# Patient Record
Sex: Male | Born: 1980
Health system: Southern US, Community
[De-identification: ages and names within clinical notes are randomized; demographics above are authoritative.]

## PROBLEM LIST (undated history)

## (undated) DIAGNOSIS — F419 Anxiety disorder, unspecified: Secondary | ICD-10-CM

## (undated) DIAGNOSIS — F329 Major depressive disorder, single episode, unspecified: Secondary | ICD-10-CM

## (undated) DIAGNOSIS — F32A Depression, unspecified: Secondary | ICD-10-CM

## (undated) DIAGNOSIS — M109 Gout, unspecified: Secondary | ICD-10-CM

## (undated) DIAGNOSIS — I10 Essential (primary) hypertension: Secondary | ICD-10-CM

## (undated) DIAGNOSIS — M1990 Unspecified osteoarthritis, unspecified site: Secondary | ICD-10-CM

## (undated) HISTORY — PX: FOOT SURGERY: SHX648

## (undated) HISTORY — DX: Unspecified osteoarthritis, unspecified site: M19.90

## (undated) HISTORY — DX: Gout, unspecified: M10.9

## (undated) HISTORY — DX: Major depressive disorder, single episode, unspecified: F32.9

## (undated) HISTORY — PX: APPENDECTOMY: SHX54

## (undated) HISTORY — DX: Depression, unspecified: F32.A

## (undated) HISTORY — PX: GASTRIC BYPASS: SHX52

## (undated) HISTORY — PX: CHOLECYSTECTOMY: SHX55

## (undated) HISTORY — DX: Essential (primary) hypertension: I10

---

## 2004-11-03 ENCOUNTER — Other Ambulatory Visit: Payer: Self-pay

## 2004-11-03 ENCOUNTER — Emergency Department: Payer: Self-pay | Admitting: Emergency Medicine

## 2005-08-09 ENCOUNTER — Emergency Department: Payer: Self-pay | Admitting: Emergency Medicine

## 2005-10-28 ENCOUNTER — Encounter: Admission: RE | Admit: 2005-10-28 | Discharge: 2005-10-28 | Payer: Self-pay | Admitting: Orthopedic Surgery

## 2007-11-19 ENCOUNTER — Emergency Department: Payer: Self-pay | Admitting: Emergency Medicine

## 2009-07-03 ENCOUNTER — Ambulatory Visit: Payer: Self-pay | Admitting: Podiatry

## 2010-05-26 ENCOUNTER — Emergency Department: Payer: Self-pay | Admitting: Emergency Medicine

## 2010-06-02 ENCOUNTER — Encounter: Payer: Self-pay | Admitting: Orthopedic Surgery

## 2010-06-04 ENCOUNTER — Ambulatory Visit: Payer: Self-pay | Admitting: Specialist

## 2010-06-12 ENCOUNTER — Ambulatory Visit: Payer: Self-pay | Admitting: Specialist

## 2010-07-11 ENCOUNTER — Ambulatory Visit: Payer: Self-pay | Admitting: Specialist

## 2010-09-20 ENCOUNTER — Emergency Department: Payer: Self-pay | Admitting: Emergency Medicine

## 2012-02-20 ENCOUNTER — Emergency Department: Payer: Self-pay | Admitting: Emergency Medicine

## 2012-02-20 LAB — COMPREHENSIVE METABOLIC PANEL
Albumin: 4.1 g/dL (ref 3.4–5.0)
BUN: 15 mg/dL (ref 7–18)
Bilirubin,Total: 0.6 mg/dL (ref 0.2–1.0)
Chloride: 109 mmol/L — ABNORMAL HIGH (ref 98–107)
Creatinine: 1.38 mg/dL — ABNORMAL HIGH (ref 0.60–1.30)
Glucose: 83 mg/dL (ref 65–99)
SGPT (ALT): 23 U/L (ref 12–78)
Total Protein: 7.7 g/dL (ref 6.4–8.2)

## 2012-02-20 LAB — CBC
Platelet: 198 10*3/uL (ref 150–440)
RBC: 5.15 10*6/uL (ref 4.40–5.90)
WBC: 8 10*3/uL (ref 3.8–10.6)

## 2012-02-20 LAB — CK TOTAL AND CKMB (NOT AT ARMC)
CK, Total: 216 U/L (ref 35–232)
CK-MB: <0.5 ng/mL — ABNORMAL LOW (ref 0.5–3.6)

## 2012-02-21 LAB — URINALYSIS, COMPLETE
Bacteria: NONE SEEN
Glucose,UR: NEGATIVE mg/dL (ref 0–75)
Nitrite: NEGATIVE
Protein: NEGATIVE
RBC,UR: NONE SEEN /HPF (ref 0–5)
Specific Gravity: 1.016 (ref 1.003–1.030)
WBC UR: 32 /HPF (ref 0–5)

## 2012-02-21 LAB — DRUG SCREEN, URINE
Cocaine Metabolite,Ur ~~LOC~~: NEGATIVE (ref ?–300)
Methadone, Ur Screen: NEGATIVE (ref ?–300)
Opiate, Ur Screen: NEGATIVE (ref ?–300)
Phencyclidine (PCP) Ur S: NEGATIVE (ref ?–25)
Tricyclic, Ur Screen: NEGATIVE (ref ?–1000)

## 2012-11-14 ENCOUNTER — Inpatient Hospital Stay: Payer: Self-pay | Admitting: Surgery

## 2012-11-14 LAB — COMPREHENSIVE METABOLIC PANEL
Albumin: 3.9 g/dL (ref 3.4–5.0)
BUN: 11 mg/dL (ref 7–18)
Bilirubin,Total: 1 mg/dL (ref 0.2–1.0)
Co2: 31 mmol/L (ref 21–32)
EGFR (Non-African Amer.): >60
Osmolality: 278 (ref 275–301)
Potassium: 3.6 mmol/L (ref 3.5–5.1)

## 2012-11-14 LAB — URINALYSIS, COMPLETE
Blood: NEGATIVE
Ketone: NEGATIVE
Leukocyte Esterase: NEGATIVE
Ph: 5 (ref 4.5–8.0)
Protein: NEGATIVE
RBC,UR: 1 /HPF (ref 0–5)
Squamous Epithelial: NONE SEEN

## 2012-11-14 LAB — CBC
MCHC: 34.4 g/dL (ref 32.0–36.0)
Platelet: 251 10*3/uL (ref 150–440)
RBC: 5.41 10*6/uL (ref 4.40–5.90)
RDW: 14.1 % (ref 11.5–14.5)

## 2014-02-04 ENCOUNTER — Emergency Department: Payer: Self-pay | Admitting: Emergency Medicine

## 2014-04-03 DIAGNOSIS — E669 Obesity, unspecified: Secondary | ICD-10-CM | POA: Insufficient documentation

## 2014-04-03 DIAGNOSIS — F419 Anxiety disorder, unspecified: Secondary | ICD-10-CM | POA: Insufficient documentation

## 2014-04-03 DIAGNOSIS — Z9889 Other specified postprocedural states: Secondary | ICD-10-CM | POA: Insufficient documentation

## 2014-08-11 ENCOUNTER — Emergency Department
Admit: 2014-08-11 | Disposition: A | Discharge status: Home / Self Care | Payer: Self-pay | Admitting: Emergency Medicine

## 2014-08-17 ENCOUNTER — Emergency Department
Admit: 2014-08-17 | Disposition: A | Discharge status: Home / Self Care | Payer: Self-pay | Admitting: Emergency Medicine

## 2014-08-17 LAB — BASIC METABOLIC PANEL
Anion Gap: 6 — ABNORMAL LOW (ref 7–16)
BUN: 12 mg/dL
CALCIUM: 9 mg/dL
CHLORIDE: 107 mmol/L
Co2: 27 mmol/L
Creatinine: 1.23 mg/dL
EGFR (African American): >60
Glucose: 91 mg/dL
Potassium: 3.8 mmol/L
Sodium: 140 mmol/L

## 2014-08-17 LAB — CBC WITH DIFFERENTIAL/PLATELET
BASOS ABS: 0 10*3/uL (ref 0.0–0.1)
Basophil %: 0.7 %
Eosinophil #: 0.2 10*3/uL (ref 0.0–0.7)
Eosinophil %: 3.6 %
HCT: 40.9 % (ref 40.0–52.0)
HGB: 13.5 g/dL (ref 13.0–18.0)
LYMPHS ABS: 2.5 10*3/uL (ref 1.0–3.6)
LYMPHS PCT: 39.1 %
MCH: 26.2 pg (ref 26.0–34.0)
MCHC: 32.9 g/dL (ref 32.0–36.0)
MCV: 80 fL (ref 80–100)
MONOS PCT: 8.9 %
Monocyte #: 0.6 x10 3/mm (ref 0.2–1.0)
Neutrophil #: 3.1 10*3/uL (ref 1.4–6.5)
Neutrophil %: 47.7 %
PLATELETS: 188 10*3/uL (ref 150–440)
RBC: 5.14 10*6/uL (ref 4.40–5.90)
RDW: 13.8 % (ref 11.5–14.5)
WBC: 6.4 10*3/uL (ref 3.8–10.6)

## 2014-08-17 LAB — URIC ACID: URIC ACID: 8 mg/dL — AB

## 2014-09-01 NOTE — Op Note (Signed)
PATIENT NAME:  Nicholas MccreedyCHELLEY, Dakari M MR#:  [id number] DATE OF BIRTH:  01-02-1981  DATE OF PROCEDURE:  11/14/2012  PREOPERATIVE DIAGNOSIS: Acute appendicitis.   POSTOPERATIVE DIAGNOSIS: Acute suppurative appendicitis.   PROCEDURE PERFORMED:  Appendectomy  SURGEON: Quentin Orealph L. Ely, III, MD   ANESTHESIA: General.   OPERATIVE PROCEDURE: With the patient in the supine position after induction of appropriate anesthesia, the patient's abdomen was prepped with ChloraPrep and draped with sterile towels. An infraumbilical incision was made in the standard fashion, carried down bluntly through the subcutaneous tissue. The patient was placed in a head down, feet up position. The Veress needle was used to cannulate the peritoneal cavity. CO2 was insufflated to appropriate pressure measurements. When approximately 2.5 liters of CO2 were instilled, the Veress needle was withdrawn. An 11 mm [employer] port was inserted into the peritoneal cavity. Intraperitoneal position was confirmed. CO2 was reinsufflated. There was large dilated suppurative appendicitis. The appendix was identified in the right lower quadrant. Mid epigastric transverse incision was made and an 11 mm port inserted under direct vision. A suprapubic transverse incision was made and a 12 mm port inserted under direct vision. The camera was moved to the upper port and dissection carried out through the 2 lower ports. The base of the appendix was identified with some difficulty. The mesoappendix was divided between 2 applications of the Endo GIA stapler carrying a white load, and the appendix was dissected back to its base without significant bleeding. The base of the appendix was divided using a single application of the Endo GIA stapler carrying a blue load. The appendix was captured and the Endo Catch apparatus removed through the suprapubic incision. The area was copiously irrigated with 2 liters of warm saline solution. Hemostasis appeared to be  satisfactory and no significant infection. The suprapubic incision was closed with figure-of-eight suture of 0 Vicryl using the suture passer apparatus and a 0 Vicryl. The abdomen was desufflated, and all ports were withdrawn without difficulty. Skin incisions were closed with 5-0 nylon; 0.5% Marcaine was injected for postoperative pain control. Sterile dressings were applied.    The patient returned to the recovery room having tolerated the procedure well. Sponge, instrument and needle counts were correct x 2 in the operating room.     ____________________________ Quentin Orealph L. Ely III, MD rle:cb D: 11/14/2012 17:34:03 ET T: 11/14/2012 21:52:18 ET JOB#: [id number]  cc: Carmie Endalph L. Ely III, MD, <Dictator> Mickel Crowobert J. Mead, MD Quentin OreALPH L ELY MD ELECTRONICALLY SIGNED 11/22/2012 21:31

## 2014-09-01 NOTE — H&P (Signed)
PATIENT NAME:  Nicholas [name], Nicholas [name] MR#:  [id number] DATE OF BIRTH:  Oct 15, 1980  DATE OF ADMISSION:  11/14/2012  PRIMARY CARE PHYSICIAN:  [facility].   ADMITTING PHYSICIAN:  Dr. Michela PitcherEly.   CHIEF COMPLAINT:  Abdominal pain, nausea.   BRIEF HISTORY:  Nicholas [name] is a 34 year old gentleman who was seen in the Emergency Room with a 36 to 48-hour history of abdominal discomfort, bloating and nausea. First symptom was on July 4th when he noted some bloating and abdominal distention following a meal. He felt uncomfortable. He thought he might be constipated. He took a laxative without any real improvement. Over the course of the next 24 hours, pain has worsened primarily with  suprapubic right lower quadrant pain. He presented to the Emergency Room tonight for further evaluation. White blood cell count was slightly elevated. CT scan was performed which demonstrated what appeared to be a thickened, inflamed appendix with pericecal and periappendiceal fluid, suggestive of acute appendicitis. The surgical service was consulted.   The patient denies any other significant symptoms. He has no diarrhea, fever or chills. He has no history of hepatitis, yellow jaundice, pancreatitis, peptic ulcer disease or diverticulitis. He does have a previous history of biliary tract disease. Underwent laparoscopic cholecystectomy. Has also had a history of morbid obesity and underwent a laparoscopic sleeve gastrectomy. He has lost approximately 140 pounds. He has no history of cardiac disease, hypertension or diabetes currently, although he was hypertensive prior to his weight loss. He does not smoke cigarettes. Drinks alcohol only occasionally.   FAMILY HISTORY:  Noncontributory.   MEDICATIONS:  He takes no medications regularly.   ALLERGIES:  He has no medical allergies.   REVIEW OF SYSTEMS:  Undertaken with the patient and it is negative. He has no other significant problems at the present time.    PHYSICAL  EXAMINATION:   GENERAL:  He is an alert, relatively pleasant gentleman in moderate distress from pain.  VITALS:  Blood pressure 129/75, heart rate 62 and regular. Pain scale is a 0 at this time as he has received pain medicine.  HEENT:  Unremarkable. He has no scleral icterus. No pupillary abnormalities.  NECK:  Supple, nontender with midline trachea. No adenopathy.  CHEST:  Clear with no adventitious sounds and normal pulmonary excursion.  CARDIAC:  No murmurs or gallops. He seems to be in normal sinus rhythm.  ABDOMEN:  Generally soft, but does have point tenderness in the right lower quadrant with moderate guarding and no rebound. He has hypoactive present bowel sounds.  EXTREMITY:  Reveals full range of motion. No deformities and good distal pulses.  PSYCHIATRIC:  Reveals some mild affect changes, but no disorientation.   I have independently reviewed his CT scan. His findings, both clinically and on scans, suggest acute appendicitis. We will recommend admission to the hospital with surgical intervention. I am concerned his appendix may be ruptured. The risks of appendectomy and the implications of rupture have been outlined to the patient and accepted.   ____________________________ Carmie Endalph L. Ely III, MD rle:kc D: 11/14/2012 07:54:06 ET T: 11/14/2012 08:14:45 ET JOB#: [id number]  cc: Carmie Endalph L. Ely III, MD, <Dictator> [facility] Quentin OreALPH L ELY MD ELECTRONICALLY SIGNED 11/22/2012 21:31

## 2014-11-06 ENCOUNTER — Ambulatory Visit: Payer: 59 | Admitting: Podiatry

## 2014-12-04 ENCOUNTER — Ambulatory Visit: Payer: 59 | Admitting: Podiatry

## 2015-04-29 ENCOUNTER — Encounter: Payer: Self-pay | Admitting: Emergency Medicine

## 2015-04-29 ENCOUNTER — Emergency Department
Admission: EM | Admit: 2015-04-29 | Discharge: 2015-04-29 | Disposition: A | Discharge status: Home / Self Care | Payer: BLUE CROSS/BLUE SHIELD | Attending: Emergency Medicine | Admitting: Emergency Medicine

## 2015-04-29 ENCOUNTER — Emergency Department: Payer: BLUE CROSS/BLUE SHIELD

## 2015-04-29 DIAGNOSIS — F419 Anxiety disorder, unspecified: Secondary | ICD-10-CM | POA: Diagnosis not present

## 2015-04-29 DIAGNOSIS — R079 Chest pain, unspecified: Secondary | ICD-10-CM

## 2015-04-29 DIAGNOSIS — R51 Headache: Secondary | ICD-10-CM | POA: Diagnosis not present

## 2015-04-29 HISTORY — DX: Anxiety disorder, unspecified: F41.9

## 2015-04-29 LAB — TROPONIN I

## 2015-04-29 LAB — CBC
HCT: 43 % (ref 40.0–52.0)
Hemoglobin: 14.3 g/dL (ref 13.0–18.0)
MCH: 25.9 pg — AB (ref 26.0–34.0)
MCHC: 33.2 g/dL (ref 32.0–36.0)
MCV: 78.1 fL — AB (ref 80.0–100.0)
PLATELETS: 213 10*3/uL (ref 150–440)
RBC: 5.5 MIL/uL (ref 4.40–5.90)
RDW: 14.1 % (ref 11.5–14.5)
WBC: 6.9 10*3/uL (ref 3.8–10.6)

## 2015-04-29 LAB — BASIC METABOLIC PANEL
Anion gap: 4 — ABNORMAL LOW (ref 5–15)
BUN: 13 mg/dL (ref 6–20)
CALCIUM: 9.7 mg/dL (ref 8.9–10.3)
CHLORIDE: 109 mmol/L (ref 101–111)
CO2: 28 mmol/L (ref 22–32)
CREATININE: 1.49 mg/dL — AB (ref 0.61–1.24)
GFR calc non Af Amer: 59 mL/min — ABNORMAL LOW (ref >60)
GLUCOSE: 87 mg/dL (ref 65–99)
Potassium: 4.4 mmol/L (ref 3.5–5.1)
Sodium: 141 mmol/L (ref 135–145)

## 2015-04-29 MED ORDER — LORAZEPAM 1 MG PO TABS: 1.0000 mg | ORAL_TABLET | Freq: Three times a day (TID) | ORAL | Status: DC | PRN

## 2015-04-29 NOTE — Discharge Instructions (Signed)
You have been seen in the emergency department for chest pain. This could likely be related to her underlying anxiety. Please take your medication as needed, as prescribed. Please call the number provided for [facility] to arrange a follow-up appointment to discuss this further including the possibility of obtaining a stress test.   Nonspecific Chest Pain  Chest pain can be caused by many different conditions. There is always a chance that your pain could be related to something serious, such as a heart attack or a blood clot in your lungs. Chest pain can also be caused by conditions that are not life-threatening. If you have chest pain, it is very important to follow up with your health care provider. CAUSES  Chest pain can be caused by:  Heartburn.  Pneumonia or bronchitis.  Anxiety or stress.  Inflammation around your heart (pericarditis) or lung (pleuritis or pleurisy).  A blood clot in your lung.  A collapsed lung (pneumothorax). It can develop suddenly on its own (spontaneous pneumothorax) or from trauma to the chest.  Shingles infection (varicella-zoster virus).  Heart attack.  Damage to the bones, muscles, and cartilage that make up your chest wall. This can include:  Bruised bones due to injury.  Strained muscles or cartilage due to frequent or repeated coughing or overwork.  Fracture to one or more ribs.  Sore cartilage due to inflammation (costochondritis). RISK FACTORS  Risk factors for chest pain may include:  Activities that increase your risk for trauma or injury to your chest.  Respiratory infections or conditions that cause frequent coughing.  Medical conditions or overeating that can cause heartburn.  Heart disease or family history of heart disease.  Conditions or health behaviors that increase your risk of developing a blood clot.  Having had chicken pox (varicella zoster). SIGNS AND SYMPTOMS Chest pain can feel like:  Burning or tingling on  the surface of your chest or deep in your chest.  Crushing, pressure, aching, or squeezing pain.  Dull or sharp pain that is worse when you move, cough, or take a deep breath.  Pain that is also felt in your back, neck, shoulder, or arm, or pain that spreads to any of these areas. Your chest pain may come and go, or it may stay constant. DIAGNOSIS Lab tests or other studies may be needed to find the cause of your pain. Your health care provider may have you take a test called an ambulatory ECG (electrocardiogram). An ECG records your heartbeat patterns at the time the test is performed. You may also have other tests, such as:  Transthoracic echocardiogram (TTE). During echocardiography, sound waves are used to create a picture of all of the heart structures and to look at how blood flows through your heart.  Transesophageal echocardiogram (TEE).This is a more advanced imaging test that obtains images from inside your body. It allows your health care provider to see your heart in finer detail.  Cardiac monitoring. This allows your health care provider to monitor your heart rate and rhythm in real time.  Holter monitor. This is a portable device that records your heartbeat and can help to diagnose abnormal heartbeats. It allows your health care provider to track your heart activity for several days, if needed.  Stress tests. These can be done through exercise or by taking medicine that makes your heart beat more quickly.  Blood tests.  Imaging tests. TREATMENT  Your treatment depends on what is causing your chest pain. Treatment may include:  Medicines. These  may include:  Acid blockers for heartburn.  Anti-inflammatory medicine.  Pain medicine for inflammatory conditions.  Antibiotic medicine, if an infection is present.  Medicines to dissolve blood clots.  Medicines to treat coronary artery disease.  Supportive care for conditions that do not require medicines. This may  include:  Resting.  Applying heat or cold packs to injured areas.  Limiting activities until pain decreases. HOME CARE INSTRUCTIONS  If you were prescribed an antibiotic medicine, finish it all even if you start to feel better.  Avoid any activities that bring on chest pain.  Do not use any tobacco products, including cigarettes, chewing tobacco, or electronic cigarettes. If you need help quitting, ask your health care provider.  Do not drink alcohol.  Take medicines only as directed by your health care provider.  Keep all follow-up visits as directed by your health care provider. This is important. This includes any further testing if your chest pain does not go away.  If heartburn is the cause for your chest pain, you may be told to keep your head raised (elevated) while sleeping. This reduces the chance that acid will go from your stomach into your esophagus.  Make lifestyle changes as directed by your health care provider. These may include:  Getting regular exercise. Ask your health care provider to suggest some activities that are safe for you.  Eating a heart-healthy diet. A registered dietitian can help you to learn healthy eating options.  Maintaining a healthy weight.  Managing diabetes, if necessary.  Reducing stress. SEEK MEDICAL CARE IF:  Your chest pain does not go away after treatment.  You have a rash with blisters on your chest.  You have a fever. SEEK IMMEDIATE MEDICAL CARE IF:   Your chest pain is worse.  You have an increasing cough, or you cough up blood.  You have severe abdominal pain.  You have severe weakness.  You faint.  You have chills.  You have sudden, unexplained chest discomfort.  You have sudden, unexplained discomfort in your arms, back, neck, or jaw.  You have shortness of breath at any time.  You suddenly start to sweat, or your skin gets clammy.  You feel nauseous or you vomit.  You suddenly feel light-headed or  dizzy.  Your heart begins to beat quickly, or it feels like it is skipping beats. These symptoms may represent a serious problem that is an emergency. Do not wait to see if the symptoms will go away. Get medical help right away. Call your local emergency services (911 in the [place] Do not drive yourself to the hospital.   This information is not intended to replace advice given to you by your health care provider. Make sure you discuss any questions you have with your health care provider.   Document Released: 02/05/2005 Document Revised: 05/19/2014 Document Reviewed: 12/02/2013 Elsevier Interactive Patient Education [employer].

## 2015-04-29 NOTE — ED Notes (Signed)
Pt presents to ED with chest pain that started when he went to work last Monday. Pt states he noticed it went he ate food. Pt states this has been consistent all week with headaches and nausea. Pt states he has been taking BC powder for the headaches 2x a day. 6/10 pain

## 2015-04-29 NOTE — ED Provider Notes (Signed)
[facility] Emergency Department Provider Note  Time seen: 2:58 PM  I have reviewed the triage vital signs and the nursing notes.   HISTORY  Chief Complaint Chest Pain    HPI Nicholas [name] is a 34 y.o. male with a past medical history of anxiety who presented to the emergency department with chest discomfort. According to the patient for the past 5-6 days he has had intermittent chest discomfort, along with intermittent headaches. Denies any shortness of breath, nausea, diaphoresis. States he seems to have somewhat worse pain while he is eating, states sometimes pain will last for several minutes sometimes several hours. States mild chest tightness in the upper chest currently. States he would not call it "pain." States a history of anxiety which typically presents as chest discomfort for the patient. Recently moved to [place], and is no longer prescribe medications for his anxiety at the moment.     Past Medical History  Diagnosis Date  . Anxiety     There are no active problems to display for this patient.   Past Surgical History  Procedure Laterality Date  . Cholecystectomy    . Gastric bypass      No current outpatient prescriptions on file.  Allergies Review of patient's allergies indicates no known allergies.  History reviewed. No pertinent family history.  Social History Social History  Substance Use Topics  . Smoking status: Never Smoker   . Smokeless tobacco: None  . Alcohol Use: No    Review of Systems Constitutional: Negative for fever. Cardiovascular: Upper chest discomfort Respiratory: Negative for shortness of breath. Gastrointestinal: Negative for abdominal pain Genitourinary: Negative for dysuria. Musculoskeletal: Negative for back pain. Neurological: Intermittent headache, denies any currently. Denies any focal weakness or numbness. 10-point ROS otherwise  negative.  ____________________________________________   PHYSICAL EXAM:  VITAL SIGNS: ED Triage Vitals  Enc Vitals Group     BP 04/29/15 1203 127/74 mmHg     Pulse Rate 04/29/15 1203 94     Resp 04/29/15 1203 18     Temp 04/29/15 1203 98.4 F (36.9 C)     Temp Source 04/29/15 1203 Oral     SpO2 04/29/15 1203 99 %     Weight 04/29/15 1203 320 lb (145.151 kg)     Height 04/29/15 1203  (1.905 m)     Head Cir --      Peak Flow --      Pain Score 04/29/15 1204 6     Pain Loc --      Pain Edu? --      Excl. in GC? --     Constitutional: Alert and oriented. Well appearing and in no distress. Eyes: Normal exam ENT   Head: Normocephalic and atraumatic.   Mouth/Throat: Mucous membranes are moist. Cardiovascular: Normal rate, regular rhythm. No murmur Respiratory: Normal respiratory effort without tachypnea nor retractions. Breath sounds are clear  Gastrointestinal: Soft and nontender. No distention.   Musculoskeletal: Nontender with normal range of motion in all extremities. Neurologic:  Normal speech and language. No gross focal neurologic deficits Skin:  Skin is warm, dry and intact.  Psychiatric: Mood and affect are normal. Speech and behavior are normal.   ____________________________________________    EKG  EKG reviewed and interpreted, self shows normal sinus rhythm at 84 bpm, narrow QRS, normal axis, normal intervals, nonspecific ST changes but no concerning abnormalities noted.  ____________________________________________    RADIOLOGY  Chest x-ray shows no acute disease.  INITIAL IMPRESSION / ASSESSMENT  AND PLAN / ED COURSE  Pertinent labs & imaging results that were available during my care of the patient were reviewed by me and considered in my medical decision making (see chart for details).  Patient presents with intermittent upper chest discomfort, denies any currently but states it has been ongoing for 5-6 days. Patient's labs are within  normal limits including negative troponin. Chest x-ray negative. Patient states a history of anxiety which often presents as chest discomfort but is no longer on medication since he moved to [place] 3 months ago. Overall the patient appears very well with a normal examination. We'll discharge the patient on a short course of anxiety medication, as well as [facility] follow-up to arrange a stress test. Patient is agreeable to this plan.  ____________________________________________   FINAL CLINICAL IMPRESSION(S) / ED DIAGNOSES  Chest pain Anxiety   Minna AntisKevin Quaniya Damas, MD 04/29/15 [phone]

## 2015-07-16 ENCOUNTER — Ambulatory Visit (INDEPENDENT_AMBULATORY_CARE_PROVIDER_SITE_OTHER): Payer: BLUE CROSS/BLUE SHIELD | Admitting: Family

## 2015-07-16 ENCOUNTER — Encounter: Payer: Self-pay | Admitting: Family

## 2015-07-16 VITALS — BP 110/80 | HR 85 | Temp 98.0°F | Resp 16 | Ht 74.0 in | Wt 330.8 lb

## 2015-07-16 DIAGNOSIS — F411 Generalized anxiety disorder: Secondary | ICD-10-CM

## 2015-07-16 DIAGNOSIS — J302 Other seasonal allergic rhinitis: Secondary | ICD-10-CM

## 2015-07-16 DIAGNOSIS — G479 Sleep disorder, unspecified: Secondary | ICD-10-CM | POA: Diagnosis not present

## 2015-07-16 MED ORDER — LEVOCETIRIZINE DIHYDROCHLORIDE 5 MG PO TABS: 5.0000 mg | ORAL_TABLET | Freq: Every evening | ORAL | Status: DC

## 2015-07-16 MED ORDER — LORAZEPAM 1 MG PO TABS: 1.0000 mg | ORAL_TABLET | Freq: Two times a day (BID) | ORAL | Status: DC | PRN

## 2015-07-16 NOTE — Assessment & Plan Note (Signed)
Seasonal allergies with questionable use of over-the-counter medication. Start Xyzal. Continue nasal corticosteroids as needed. If symptoms worsen or fail to improve consider allergy testing or Singulair.

## 2015-07-16 NOTE — Assessment & Plan Note (Signed)
Currently maintained on over-the-counter sleep aids and reporting approximately 4-5 hours of sleep nightly as he is on the road. Continue current dosage of over-the-counter medication and follow-up if symptoms worsen.

## 2015-07-16 NOTE — Assessment & Plan Note (Signed)
Symptoms and exam consistent with anxiety that remains labile. Discussed using lorazepam as needed as opposed to twice daily as his symptoms come and go. Change lorazepam to 1 tablet by mouth twice daily as needed for anxiety. [place] controlled substance database reviewed with no irregularities. Follow-up in one month to determine effectiveness

## 2015-07-16 NOTE — Patient Instructions (Signed)
Thank you for choosing [facility].  Summary/Instructions:  Your prescription(s) have been submitted to your pharmacy or been printed and provided for you. Please take as directed and contact our office if you believe you are having problem(s) with the medication(s) or have any questions.  If your symptoms worsen or fail to improve, please contact our office for further instruction, or in case of emergency go directly to the emergency room at the closest medical facility.   Allergic Rhinitis Allergic rhinitis is when the mucous membranes in the nose respond to allergens. Allergens are particles in the air that cause your body to have an allergic reaction. This causes you to release allergic antibodies. Through a chain of events, these eventually cause you to release histamine into the blood stream. Although meant to protect the body, it is this release of histamine that causes your discomfort, such as frequent sneezing, congestion, and an itchy, runny nose.  CAUSES Seasonal allergic rhinitis (hay fever) is caused by pollen allergens that may come from grasses, trees, and weeds. Year-round allergic rhinitis (perennial allergic rhinitis) is caused by allergens such as house dust mites, pet dander, and mold spores. SYMPTOMS  Nasal stuffiness (congestion).  Itchy, runny nose with sneezing and tearing of the eyes. DIAGNOSIS Your health care provider can help you determine the allergen or allergens that trigger your symptoms. If you and your health care provider are unable to determine the allergen, skin or blood testing may be used. Your health care provider will diagnose your condition after taking your health history and performing a physical exam. Your health care provider may assess you for other related conditions, such as asthma, pink eye, or an ear infection. TREATMENT Allergic rhinitis does not have a cure, but it can be controlled by:  Medicines that block allergy symptoms. These may  include allergy shots, nasal sprays, and oral antihistamines.  Avoiding the allergen. Hay fever may often be treated with antihistamines in pill or nasal spray forms. Antihistamines block the effects of histamine. There are over-the-counter medicines that may help with nasal congestion and swelling around the eyes. Check with your health care provider before taking or giving this medicine. If avoiding the allergen or the medicine prescribed do not work, there are many new medicines your health care provider can prescribe. Stronger medicine may be used if initial measures are ineffective. Desensitizing injections can be used if medicine and avoidance does not work. Desensitization is when a patient is given ongoing shots until the body becomes less sensitive to the allergen. Make sure you follow up with your health care provider if problems continue. HOME CARE INSTRUCTIONS It is not possible to completely avoid allergens, but you can reduce your symptoms by taking steps to limit your exposure to them. It helps to know exactly what you are allergic to so that you can avoid your specific triggers. SEEK MEDICAL CARE IF:  You have a fever.  You develop a cough that does not stop easily (persistent).  You have shortness of breath.  You start wheezing.  Symptoms interfere with normal daily activities.   This information is not intended to replace advice given to you by your health care provider. Make sure you discuss any questions you have with your health care provider.   Document Released: 01/21/2001 Document Revised: 05/19/2014 Document Reviewed: 01/03/2013 Elsevier Interactive Patient Education 2016 [place] and Stress Management Stress is a normal reaction to life events. It is what you feel when life demands more than  you are used to or more than you can handle. Some stress can be useful. For example, the stress reaction can help you catch the last bus of the day, study for a  test, or meet a deadline at work. But stress that occurs too often or for too long can cause problems. It can affect your emotional health and interfere with relationships and normal daily activities. Too much stress can weaken your immune system and increase your risk for physical illness. If you already have a medical problem, stress can make it worse. CAUSES  All sorts of life events may cause stress. An event that causes stress for one person may not be stressful for another person. Major life events commonly cause stress. These may be positive or negative. Examples include losing your job, moving into a new home, getting married, having a baby, or losing a loved one. Less obvious life events may also cause stress, especially if they occur day after day or in combination. Examples include working long hours, driving in traffic, caring for children, being in debt, or being in a difficult relationship. SIGNS AND SYMPTOMS Stress may cause emotional symptoms including, the following:  Anxiety. This is feeling worried, afraid, on edge, overwhelmed, or out of control.  Anger. This is feeling irritated or impatient.  Depression. This is feeling sad, down, helpless, or guilty.  Difficulty focusing, remembering, or making decisions. Stress may cause physical symptoms, including the following:   Aches and pains. These may affect your head, neck, back, stomach, or other areas of your body.  Tight muscles or clenched jaw.  Low energy or trouble sleeping. Stress may cause unhealthy behaviors, including the following:   Eating to feel better (overeating) or skipping meals.  Sleeping too little, too much, or both.  Working too much or putting off tasks (procrastination).  Smoking, drinking alcohol, or using drugs to feel better. DIAGNOSIS  Stress is diagnosed through an assessment by your health care provider. Your health care provider will ask questions about your symptoms and any stressful life  events.Your health care provider will also ask about your medical history and may order blood tests or other tests. Certain medical conditions and medicine can cause physical symptoms similar to stress. Mental illness can cause emotional symptoms and unhealthy behaviors similar to stress. Your health care provider may refer you to a mental health professional for further evaluation.  TREATMENT  Stress management is the recommended treatment for stress.The goals of stress management are reducing stressful life events and coping with stress in healthy ways.  Techniques for reducing stressful life events include the following:  Stress identification. Self-monitor for stress and identify what causes stress for you. These skills may help you to avoid some stressful events.  Time management. Set your priorities, keep a calendar of events, and learn to say "no." These tools can help you avoid making too many commitments. Techniques for coping with stress include the following:  Rethinking the problem. Try to think realistically about stressful events rather than ignoring them or overreacting. Try to find the positives in a stressful situation rather than focusing on the negatives.  Exercise. Physical exercise can release both physical and emotional tension. The key is to find a form of exercise you enjoy and do it regularly.  Relaxation techniques. These relax the body and mind. Examples include yoga, meditation, tai chi, biofeedback, deep breathing, progressive muscle relaxation, listening to music, being out in nature, journaling, and other hobbies. Again, the key is to find  one or more that you enjoy and can do regularly.  Healthy lifestyle. Eat a balanced diet, get plenty of sleep, and do not smoke. Avoid using alcohol or drugs to relax.  Strong support network. Spend time with family, friends, or other people you enjoy being around.Express your feelings and talk things over with someone you  trust. Counseling or talktherapy with a mental health professional may be helpful if you are having difficulty managing stress on your own. Medicine is typically not recommended for the treatment of stress.Talk to your health care provider if you think you need medicine for symptoms of stress. HOME CARE INSTRUCTIONS  Keep all follow-up visits as directed by your health care provider.  Take all medicines as directed by your health care provider. SEEK MEDICAL CARE IF:  Your symptoms get worse or you start having new symptoms.  You feel overwhelmed by your problems and can no longer manage them on your own. SEEK IMMEDIATE MEDICAL CARE IF:  You feel like hurting yourself or someone else.   This information is not intended to replace advice given to you by your health care provider. Make sure you discuss any questions you have with your health care provider.   Document Released: 10/22/2000 Document Revised: 05/19/2014 Document Reviewed: 12/21/2012 Elsevier Interactive Patient Education [employer].

## 2015-07-16 NOTE — Progress Notes (Signed)
Subjective:    Patient ID: Nicholas [name], male    DOB: 03-15-1981, 35 y.o.   MRN: [id number]  Chief Complaint  Patient presents with  . Establish Care    wants to talk about allergies, wants to make sure that taking a sleep aide is ok to take due to his job, has anxiety and would like a refill of ativan    HPI:  Nicholas [name] is a 35 y.o. male who  has a past medical history of Anxiety; Gout; and Hypertension. and presents today for an office visit to establish care.  1.) Seasonal allergies - Previously diagnosed with seasonal allergies. Describes that his eyes get very dry and develops sinus drainage and sneezing. Currently maintained on [employer] Allergy medication and nasal sprays which did not provide him with much relief. No formal allergy tested.   2.) Anxiety - Previously diagnosed with anxiety and most recently seen in the ED for chest pain which was believed to be related to anxiety. He was prescribed lorazepam at the time. Reports taking the medication as prescribed and denies adverse side effects. Symptoms are adequately controlled when taking the medication as needed. Currently taking on daily basis two times per day. Previously attempted on Wellbutrin which would make him very sleepy.   3.)  Sleep aides - Reports that he has difficulty falling sleep and he switches between days and nights. Has been taking OTC medications to help him sleep on the days that he works. Reports that he gets about 4-5 hours of good sleep per night. He does have a history of sleep apnea, but not since his gastric bypass surgery.   No Known Allergies   Outpatient Prescriptions Prior to Visit  Medication Sig Dispense Refill  . LORazepam (ATIVAN) 1 MG tablet Take 1 tablet (1 mg total) by mouth every 8 (eight) hours as needed for anxiety. 15 tablet 0   No facility-administered medications prior to visit.     Past Medical History  Diagnosis Date  . Anxiety   . Gout   . Hypertension       Past Surgical History  Procedure Laterality Date  . Cholecystectomy    . Gastric bypass    . Appendectomy    . Foot surgery       Family History  Problem Relation Age of Onset  . Hepatitis C Mother      Social History   Social History  . Marital Status: Married    Spouse Name: N/A  . Number of Children: 3  . 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 Smoking status: Never Smoker   . Smokeless tobacco: Never Used  . Alcohol Use: Yes     Comment: occasionally  . Drug Use: No  . Sexual Activity: Yes   Other Topics Concern  . Not on file   Social History Narrative   Fun: Basketball, weight lifting, watch movies      Review of Systems  Constitutional: Negative for fever and chills.  Respiratory: Negative for cough, chest tightness, shortness of breath and wheezing.   Cardiovascular: Negative for chest pain, palpitations and leg swelling.  Neurological: Negative for dizziness and light-headedness.  Psychiatric/Behavioral: Positive for sleep disturbance. Negative for dysphoric mood and decreased concentration. The patient is nervous/anxious.       Objective:    BP 110/80 mmHg  Pulse 85  Temp(Src) 98 F (36.7 C) (Oral)  Resp 16  Ht 6\' 2"  (1.88 m)  Wt 330 lb 12.8 oz (150.05 kg)  BMI 42.45 kg/m2  SpO2 95% Nursing note and vital signs reviewed.  Physical Exam  Constitutional: He is oriented to person, place, and time. He appears well-developed and well-nourished. No distress.  HENT:  Right Ear: Hearing, tympanic membrane, external ear and ear canal normal.  Left Ear: Hearing, tympanic membrane, external ear and ear canal normal.  Nose: Right sinus exhibits no maxillary sinus tenderness and no frontal sinus tenderness. Left sinus exhibits no maxillary sinus tenderness and no frontal sinus tenderness.  Mouth/Throat: Uvula is midline, oropharynx is clear and moist and mucous membranes are normal.    Cardiovascular: Normal rate, regular rhythm, normal heart sounds and intact distal pulses.   Pulmonary/Chest: Effort normal and breath sounds normal.  Neurological: He is alert and oriented to person, place, and time.  Skin: Skin is warm and dry.  Psychiatric: He has a normal mood and affect. His behavior is normal. Judgment and thought content normal.       Assessment & Plan:   Problem List Items Addressed This Visit      Respiratory   Seasonal allergies    Seasonal allergies with questionable use of over-the-counter medication. Start Xyzal. Continue nasal corticosteroids as needed. If symptoms worsen or fail to improve consider allergy testing or Singulair.      Relevant Medications   levocetirizine (XYZAL) 5 MG tablet     Other   Generalized anxiety disorder - Primary    Symptoms and exam consistent with anxiety that remains labile. Discussed using lorazepam as needed as opposed to twice daily as his symptoms come and go. Change lorazepam to 1 tablet by mouth twice daily as needed for anxiety. [place] controlled substance database reviewed with no irregularities. Follow-up in one month to determine effectiveness      Relevant Medications   LORazepam (ATIVAN) 1 MG tablet   Sleep disturbance    Currently maintained on over-the-counter sleep aids and reporting approximately 4-5 hours of sleep nightly as he is on the road. Continue current dosage of over-the-counter medication and follow-up if symptoms worsen.

## 2015-08-13 ENCOUNTER — Ambulatory Visit: Payer: BLUE CROSS/BLUE SHIELD | Admitting: Family

## 2015-08-15 ENCOUNTER — Encounter: Payer: Self-pay | Admitting: Family

## 2015-08-15 ENCOUNTER — Ambulatory Visit (INDEPENDENT_AMBULATORY_CARE_PROVIDER_SITE_OTHER): Payer: BLUE CROSS/BLUE SHIELD | Admitting: Family

## 2015-08-15 VITALS — BP 114/70 | HR 91 | Temp 97.9°F | Resp 16 | Ht 74.0 in | Wt 334.0 lb

## 2015-08-15 DIAGNOSIS — K219 Gastro-esophageal reflux disease without esophagitis: Secondary | ICD-10-CM | POA: Diagnosis not present

## 2015-08-15 DIAGNOSIS — J302 Other seasonal allergic rhinitis: Secondary | ICD-10-CM | POA: Diagnosis not present

## 2015-08-15 DIAGNOSIS — F411 Generalized anxiety disorder: Secondary | ICD-10-CM

## 2015-08-15 MED ORDER — PANTOPRAZOLE SODIUM 40 MG PO TBEC: 40.0000 mg | DELAYED_RELEASE_TABLET | Freq: Every day | ORAL | Status: DC

## 2015-08-15 NOTE — Assessment & Plan Note (Signed)
Symptoms and exam consistent with gastroesophageal reflux. Start pantoprazole. Discussed importance of diet and nutrition and how it relates to his feeling of nausea. Encouraged to decrease his caffeine intake. Information regarding selective food choices provided. Follow-up in one month or sooner to determine effectiveness.

## 2015-08-15 NOTE — Progress Notes (Signed)
Pre visit review using our clinic review tool, if applicable. No additional management support is needed unless otherwise documented below in the visit note. 

## 2015-08-15 NOTE — Patient Instructions (Signed)
Thank you for choosing [facility].  Summary/Instructions:  Continue current dosage of Xyzal at night.  Start the pantoprazole daily. It may take 1-2 days for it to start working.  Continue to take your lorazepam as needed for anxiety.  Your prescription(s) have been submitted to your pharmacy or been printed and provided for you. Please take as directed and contact our office if you believe you are having problem(s) with the medication(s) or have any questions.  If your symptoms worsen or fail to improve, please contact our office for further instruction, or in case of emergency go directly to the emergency room at the closest medical facility.   Food Choices for Gastroesophageal Reflux Disease, Adult When you have gastroesophageal reflux disease (GERD), the foods you eat and your eating habits are very important. Choosing the right foods can help ease the discomfort of GERD. WHAT GENERAL GUIDELINES DO I NEED TO FOLLOW?  Choose fruits, vegetables, whole grains, low-fat dairy products, and low-fat meat, fish, and poultry.  Limit fats such as oils, salad dressings, butter, nuts, and avocado.  Keep a food diary to identify foods that cause symptoms.  Avoid foods that cause reflux. These may be different for different people.  Eat frequent small meals instead of three large meals each day.  Eat your meals slowly, in a relaxed setting.  Limit fried foods.  Cook foods using methods other than frying.  Avoid drinking alcohol.  Avoid drinking large amounts of liquids with your meals.  Avoid bending over or lying down until 2-3 hours after eating. WHAT FOODS ARE NOT RECOMMENDED? The following are some foods and drinks that may worsen your symptoms: Vegetables Tomatoes. Tomato juice. Tomato and spaghetti sauce. Chili peppers. Onion and garlic. Horseradish. Fruits Oranges, grapefruit, and lemon (fruit and juice). Meats High-fat meats, fish, and poultry. This includes hot  dogs, ribs, ham, sausage, salami, and bacon. Dairy Whole milk and chocolate milk. Sour cream. Cream. Butter. Ice cream. Cream cheese.  Beverages Coffee and tea, with or without caffeine. Carbonated beverages or energy drinks. Condiments Hot sauce. Barbecue sauce.  Sweets/Desserts Chocolate and cocoa. Donuts. Peppermint and spearmint. Fats and Oils High-fat foods, including [place] fries and potato chips. Other Vinegar. Strong spices, such as black pepper, white pepper, red pepper, cayenne, curry powder, cloves, ginger, and chili powder. The items listed above may not be a complete list of foods and beverages to avoid. Contact your dietitian for more information.   This information is not intended to replace advice given to you by your health care provider. Make sure you discuss any questions you have with your health care provider.   Document Released: 04/28/2005 Document Revised: 05/19/2014 Document Reviewed: 03/02/2013 Elsevier Interactive Patient Education 2016 Elsevier Inc.  Gastroesophageal Reflux Disease, Adult Normally, food travels down the esophagus and stays in the stomach to be digested. However, when a person has gastroesophageal reflux disease (GERD), food and stomach acid move back up into the esophagus. When this happens, the esophagus becomes sore and inflamed. Over time, GERD can create small holes (ulcers) in the lining of the esophagus.  CAUSES This condition is caused by a problem with the muscle between the esophagus and the stomach (lower esophageal sphincter, or LES). Normally, the LES muscle closes after food passes through the esophagus to the stomach. When the LES is weakened or abnormal, it does not close properly, and that allows food and stomach acid to go back up into the esophagus. The LES can be weakened by certain dietary substances,  medicines, and medical conditions, including:  Tobacco use.  Pregnancy.  Having a hiatal hernia.  Heavy alcohol  use.  Certain foods and beverages, such as coffee, chocolate, onions, and peppermint. RISK FACTORS This condition is more likely to develop in:  People who have an increased body weight.  People who have connective tissue disorders.  People who use NSAID medicines. SYMPTOMS Symptoms of this condition include:  Heartburn.  Difficult or painful swallowing.  The feeling of having a lump in the throat.  Abitter taste in the mouth.  Bad breath.  Having a large amount of saliva.  Having an upset or bloated stomach.  Belching.  Chest pain.  Shortness of breath or wheezing.  Ongoing (chronic) cough or a night-time cough.  Wearing away of tooth enamel.  Weight loss. Different conditions can cause chest pain. Make sure to see your health care provider if you experience chest pain. DIAGNOSIS Your health care provider will take a medical history and perform a physical exam. To determine if you have mild or severe GERD, your health care provider may also monitor how you respond to treatment. You may also have other tests, including:  An endoscopy toexamine your stomach and esophagus with a small camera.  A test thatmeasures the acidity level in your esophagus.  A test thatmeasures how much pressure is on your esophagus.  A barium swallow or modified barium swallow to show the shape, size, and functioning of your esophagus. TREATMENT The goal of treatment is to help relieve your symptoms and to prevent complications. Treatment for this condition may vary depending on how severe your symptoms are. Your health care provider may recommend:  Changes to your diet.  Medicine.  Surgery. HOME CARE INSTRUCTIONS Diet  Follow a diet as recommended by your health care provider. This may involve avoiding foods and drinks such as:  Coffee and tea (with or without caffeine).  Drinks that containalcohol.  Energy drinks and sports drinks.  Carbonated drinks or  sodas.  Chocolate and cocoa.  Peppermint and mint flavorings.  Garlic and onions.  Horseradish.  Spicy and acidic foods, including peppers, chili powder, curry powder, vinegar, hot sauces, and barbecue sauce.  Citrus fruit juices and citrus fruits, such as oranges, lemons, and limes.  Tomato-based foods, such as red sauce, chili, salsa, and pizza with red sauce.  Fried and fatty foods, such as donuts, french fries, potato chips, and high-fat dressings.  High-fat meats, such as hot dogs and fatty cuts of red and white meats, such as rib eye steak, sausage, ham, and bacon.  High-fat dairy items, such as whole milk, butter, and cream cheese.  Eat small, frequent meals instead of large meals.  Avoid drinking large amounts of liquid with your meals.  Avoid eating meals during the 2-3 hours before bedtime.  Avoid lying down right after you eat.  Do not exercise right after you eat. General Instructions  Pay attention to any changes in your symptoms.  Take over-the-counter and prescription medicines only as told by your health care provider. Do not take aspirin, ibuprofen, or other NSAIDs unless your health care provider told you to do so.  Do not use any tobacco products, including cigarettes, chewing tobacco, and e-cigarettes. If you need help quitting, ask your health care provider.  Wear loose-fitting clothing. Do not wear anything tight around your waist that causes pressure on your abdomen.  Raise (elevate) the head of your bed 6 inches (15cm).  Try to reduce your stress, such as with  yoga or meditation. If you need help reducing stress, ask your health care provider.  If you are overweight, reduce your weight to an amount that is healthy for you. Ask your health care provider for guidance about a safe weight loss goal.  Keep all follow-up visits as told by your health care provider. This is important. SEEK MEDICAL CARE IF:  You have new symptoms.  You have  unexplained weight loss.  You have difficulty swallowing, or it hurts to swallow.  You have wheezing or a persistent cough.  Your symptoms do not improve with treatment.  You have a hoarse voice. SEEK IMMEDIATE MEDICAL CARE IF:  You have pain in your arms, neck, jaw, teeth, or back.  You feel sweaty, dizzy, or light-headed.  You have chest pain or shortness of breath.  You vomit and your vomit looks like blood or coffee grounds.  You faint.  Your stool is bloody or black.  You cannot swallow, drink, or eat.   This information is not intended to replace advice given to you by your health care provider. Make sure you discuss any questions you have with your health care provider.   Document Released: 02/05/2005 Document Revised: 01/17/2015 Document Reviewed: 08/23/2014 Elsevier Interactive Patient Education [employer] Else[employer] Inc.

## 2015-08-15 NOTE — Assessment & Plan Note (Signed)
Seasonal allergy symptoms well controlled with Xyzal although symptoms of cloudy headed feeling noted when taking during the day. Advised to take at night. If symptoms do not improve with timing of medications which can consider discontinuation of Xyzal and start Singulair. Follow-up pending trial of change in timing of medication.

## 2015-08-15 NOTE — Progress Notes (Signed)
Subjective:    Patient ID: Nicholas [name], male    DOB: 1980/11/23, 35 y.o.   MRN: [id number]  Chief Complaint  Patient presents with  . Follow-up    states that the allergy medicine has been making him feel "cloudy"     HPI:  Nicholas [name] is a 35 y.o. male who  has a past medical history of Anxiety; Gout; and Hypertension. and presents today A follow-up office visit.  1.) Seasonal allergies - previously started on Xyzal. Reports taking the medication as prescribed and notes that it makes him feel "cloudy". His symptoms are adequately managed with no eye dryness.   2.) GERD / Heartburn - Associated symptom of nausea that occurs after he drinks coffee and eats food. Does not get nausea with water. Does experience a discomfort located in the center of his chest. Denies any burning. Describes it as uncomfortable. Modifying factors include antacids which does help on occasion. He has also taken "gas pills" but it does not help for very long. Denies constipation, diarrhea or vomiting. No blood in his stool.   3.) Anxiety - currently managed with lorazepam. Reports taking medication as prescribed with no adverse side effects. Notes his symptoms are adequately controlled with current regimen. Generally takes about 1/2 pill when needed. Averaging about 4 - 1/2 tablets per week.   No Known Allergies   Current Outpatient Prescriptions on File Prior to Visit  Medication Sig Dispense Refill  . levocetirizine (XYZAL) 5 MG tablet Take 1 tablet (5 mg total) by mouth every evening. 30 tablet 0  . LORazepam (ATIVAN) 1 MG tablet Take 1 tablet (1 mg total) by mouth 2 (two) times daily as needed for anxiety. 60 tablet 0   No current facility-administered medications on file prior to visit.     Past Surgical History  Procedure Laterality Date  . Cholecystectomy    . Gastric bypass    . Appendectomy    . Foot surgery      Past Medical History  Diagnosis Date  . Anxiety   . Gout   .  Hypertension     Review of Systems  Constitutional: Negative for fever and chills.  HENT: Negative for sneezing.   Eyes: Negative for itching.  Gastrointestinal: Positive for nausea. Negative for vomiting, abdominal pain, diarrhea, constipation and blood in stool.  Psychiatric/Behavioral: Negative for dysphoric mood.      Objective:    BP 114/70 mmHg  Pulse 91  Temp(Src) 97.9 F (36.6 C) (Oral)  Resp 16  Ht  (1.88 m)  Wt 334 lb (151.501 kg)  BMI 42.86 kg/m2  SpO2 99% Nursing note and vital signs reviewed.  Physical Exam  Constitutional: He is oriented to person, place, and time. He appears well-developed and well-nourished. No distress.  Cardiovascular: Normal rate, regular rhythm, normal heart sounds and intact distal pulses.   Pulmonary/Chest: Effort normal and breath sounds normal.  Abdominal: Soft. Normal appearance and bowel sounds are normal. He exhibits no distension and no mass. There is no hepatosplenomegaly. There is no tenderness. There is no rigidity, no rebound, no guarding, no tenderness at McBurney's point and negative Murphy's sign.  Neurological: He is alert and oriented to person, place, and time.  Skin: Skin is warm and dry.  Psychiatric: He has a normal mood and affect. His behavior is normal. Judgment and thought content normal.       Assessment & Plan:   Problem List Items Addressed This Visit  Respiratory   Seasonal allergies    Seasonal allergy symptoms well controlled with Xyzal although symptoms of cloudy headed feeling noted when taking during the day. Advised to take at night. If symptoms do not improve with timing of medications which can consider discontinuation of Xyzal and start Singulair. Follow-up pending trial of change in timing of medication.        Digestive   GERD (gastroesophageal reflux disease) - Primary    Symptoms and exam consistent with gastroesophageal reflux. Start pantoprazole. Discussed importance of diet and  nutrition and how it relates to his feeling of nausea. Encouraged to decrease his caffeine intake. Information regarding selective food choices provided. Follow-up in one month or sooner to determine effectiveness.      Relevant Medications   pantoprazole (PROTONIX) 40 MG tablet     Other   Generalized anxiety disorder    Anxiety well controlled with current regimen and denies adverse side effects. Continue current dosage of lorazepam as needed for anxiety.          I am having Mr. Jolin start on pantoprazole. I am also having him maintain his levocetirizine and LORazepam.   Meds ordered this encounter  Medications  . pantoprazole (PROTONIX) 40 MG tablet    Sig: Take 1 tablet (40 mg total) by mouth daily.    Dispense:  30 tablet    Refill:  0    Order Specific Question:  Supervising Provider    Answer:  Hillard DankerRAWFORD, ELIZABETH A [[id number]]     Follow-up: Return in about 1 month (around 09/14/2015).  Jeanine Luzalone, Nicholas [place], FNP

## 2015-08-15 NOTE — Assessment & Plan Note (Signed)
Anxiety well controlled with current regimen and denies adverse side effects. Continue current dosage of lorazepam as needed for anxiety.

## 2015-08-27 ENCOUNTER — Ambulatory Visit (INDEPENDENT_AMBULATORY_CARE_PROVIDER_SITE_OTHER): Payer: BLUE CROSS/BLUE SHIELD | Admitting: Podiatry

## 2015-08-27 ENCOUNTER — Encounter: Payer: Self-pay | Admitting: Podiatry

## 2015-08-27 ENCOUNTER — Ambulatory Visit (INDEPENDENT_AMBULATORY_CARE_PROVIDER_SITE_OTHER): Payer: BLUE CROSS/BLUE SHIELD

## 2015-08-27 VITALS — BP 118/75 | HR 76 | Resp 16

## 2015-08-27 DIAGNOSIS — Q828 Other specified congenital malformations of skin: Secondary | ICD-10-CM

## 2015-08-27 DIAGNOSIS — M2040 Other hammer toe(s) (acquired), unspecified foot: Secondary | ICD-10-CM

## 2015-08-27 DIAGNOSIS — M2142 Flat foot [pes planus] (acquired), left foot: Secondary | ICD-10-CM

## 2015-08-27 DIAGNOSIS — M2141 Flat foot [pes planus] (acquired), right foot: Secondary | ICD-10-CM | POA: Diagnosis not present

## 2015-08-27 NOTE — Progress Notes (Signed)
   Subjective:    Patient ID: Warner MccreedyDonovan M Mcguinn, male    DOB: 1981-01-25, 35 y.o.   MRN: [id number]  HPI: He presents today chief complaint of painful corns and calluses with hammertoe deformity bilateral foot. He states that his feet are so flat and he has to draw his toes up when he walks and that this is causing his painful toe calluses and corns. He's also complaining of a painful left ankle which she injured at work recently.    Review of Systems  All other systems reviewed and are negative.      Objective:   Physical Exam: Vital signs are stable alert and oriented 3. Pulses are strongly palpable. Neurologic sensorium is intact percent Zosyn monofilament. Deep tendon reflexes are intact. Muscle strength is 5 over 5 dorsiflexion plantar flexors and inverters everters onto the musculature is intact. Severe pes planus/pronation bilateral. He has a stable ankle without edema left. He has flexible hammertoe deformities #2 and #34 and 5 are rigid deformities. Radiographs confirm rigid deformity of the lesser digits. No fractures are identified. Corns and calluses are present overlying the PIPJ's Nydia PJs of all the lesser toes.        Assessment & Plan:  Hammertoe deformities pes planus bilateral. Ankle sprain left.  Plan: Debrided all reactive hyperkeratoses. And he was scanned for set of orthotics today he will follow-up with his primary doctor for an MRI of the left foot. We did discuss possible need for surgical intervention to straighten the toes.

## 2015-09-05 ENCOUNTER — Encounter: Payer: Self-pay | Admitting: *Deleted

## 2015-09-11 ENCOUNTER — Telehealth: Payer: Self-pay

## 2015-09-11 DIAGNOSIS — F411 Generalized anxiety disorder: Secondary | ICD-10-CM

## 2015-09-11 MED ORDER — LORAZEPAM 1 MG PO TABS: 1.0000 mg | ORAL_TABLET | Freq: Two times a day (BID) | ORAL | Status: DC | PRN

## 2015-09-11 NOTE — Telephone Encounter (Signed)
Requesting refill for lorazepam. Last refill was on 07/16/15

## 2015-09-11 NOTE — Telephone Encounter (Signed)
Medication printed to be faxed.  

## 2015-09-11 NOTE — Telephone Encounter (Signed)
Medication faxed

## 2015-09-17 ENCOUNTER — Ambulatory Visit (INDEPENDENT_AMBULATORY_CARE_PROVIDER_SITE_OTHER): Payer: BLUE CROSS/BLUE SHIELD | Admitting: Podiatry

## 2015-09-17 ENCOUNTER — Encounter: Payer: Self-pay | Admitting: Podiatry

## 2015-09-17 DIAGNOSIS — M2142 Flat foot [pes planus] (acquired), left foot: Secondary | ICD-10-CM | POA: Diagnosis not present

## 2015-09-17 DIAGNOSIS — S93402D Sprain of unspecified ligament of left ankle, subsequent encounter: Secondary | ICD-10-CM | POA: Diagnosis not present

## 2015-09-17 DIAGNOSIS — M2141 Flat foot [pes planus] (acquired), right foot: Secondary | ICD-10-CM | POA: Diagnosis not present

## 2015-09-17 MED ORDER — MELOXICAM 15 MG PO TABS: 15.0000 mg | ORAL_TABLET | Freq: Every day | ORAL | Status: DC

## 2015-09-17 NOTE — Patient Instructions (Signed)

## 2015-09-19 NOTE — Progress Notes (Signed)
He presents today for follow-up of his left ankle and foot. He states it still bothers me some but not as bad as it did in the past. He is here today to go over his MRI results as well as to his new orthotics.  Objective: Vital signs are stable he is alert and oriented 3 we reviewed the MRI which was essentially negative with exception of some arthritis at the talonavicular joint and some mild peroneal tendinitis. As far as the ankle goes there were no complications of the ankle no re-tears of his old ankle repair.  Assessment: Arthritis of the left foot peroneal tendinitis left foot.  Plan: We'll request that he is started wearing these orthotics and I will follow-up with him in 1 month.

## 2015-10-15 ENCOUNTER — Ambulatory Visit: Payer: BLUE CROSS/BLUE SHIELD | Admitting: Podiatry

## 2015-10-16 ENCOUNTER — Telehealth: Payer: Self-pay | Admitting: *Deleted

## 2015-10-16 ENCOUNTER — Other Ambulatory Visit: Payer: Self-pay | Admitting: *Deleted

## 2015-10-16 DIAGNOSIS — F411 Generalized anxiety disorder: Secondary | ICD-10-CM

## 2015-10-16 DIAGNOSIS — J302 Other seasonal allergic rhinitis: Secondary | ICD-10-CM

## 2015-10-16 MED ORDER — LORAZEPAM 1 MG PO TABS: 1.0000 mg | ORAL_TABLET | Freq: Two times a day (BID) | ORAL | Status: DC | PRN

## 2015-10-16 MED ORDER — LEVOCETIRIZINE DIHYDROCHLORIDE 5 MG PO TABS: 5.0000 mg | ORAL_TABLET | Freq: Every evening | ORAL | Status: DC

## 2015-10-16 NOTE — Telephone Encounter (Signed)
Medication printed to be faxed.Brand

## 2015-10-16 NOTE — Telephone Encounter (Signed)
Rec'd fax pt requesting refill on Lorazepam 1 mg. Last filled 09/11/15...Raechel Chute/lmb

## 2015-10-17 NOTE — Telephone Encounter (Signed)
Faxed script back to [facility]...[facility] 

## 2015-11-05 ENCOUNTER — Other Ambulatory Visit: Payer: Self-pay | Admitting: Family

## 2015-11-05 DIAGNOSIS — K219 Gastro-esophageal reflux disease without esophagitis: Secondary | ICD-10-CM

## 2015-11-05 MED ORDER — PANTOPRAZOLE SODIUM 40 MG PO TBEC: 40.0000 mg | DELAYED_RELEASE_TABLET | Freq: Every day | ORAL | Status: DC

## 2015-11-19 ENCOUNTER — Other Ambulatory Visit: Payer: Self-pay | Admitting: Family

## 2015-11-21 ENCOUNTER — Other Ambulatory Visit: Payer: Self-pay | Admitting: Family

## 2015-11-21 DIAGNOSIS — F411 Generalized anxiety disorder: Secondary | ICD-10-CM

## 2015-11-22 MED ORDER — LORAZEPAM 1 MG PO TABS: 1.0000 mg | ORAL_TABLET | Freq: Two times a day (BID) | ORAL | Status: DC | PRN

## 2015-11-22 NOTE — Addendum Note (Signed)
Addended by: Jeanine LuzALONE, GREGORY D on: 11/22/2015 01:02 PM   Modules accepted: Orders

## 2015-12-08 ENCOUNTER — Other Ambulatory Visit: Payer: Self-pay | Admitting: Family

## 2015-12-08 DIAGNOSIS — F411 Generalized anxiety disorder: Secondary | ICD-10-CM

## 2015-12-10 MED ORDER — LORAZEPAM 1 MG PO TABS: 1.0000 mg | ORAL_TABLET | Freq: Two times a day (BID) | ORAL | 0 refills | Status: DC | PRN

## 2016-01-12 ENCOUNTER — Other Ambulatory Visit: Payer: Self-pay | Admitting: Family

## 2016-01-12 DIAGNOSIS — F411 Generalized anxiety disorder: Secondary | ICD-10-CM

## 2016-01-14 ENCOUNTER — Other Ambulatory Visit: Payer: Self-pay | Admitting: Family

## 2016-01-14 ENCOUNTER — Other Ambulatory Visit: Payer: Self-pay | Admitting: Podiatry

## 2016-01-14 DIAGNOSIS — F411 Generalized anxiety disorder: Secondary | ICD-10-CM

## 2016-01-15 ENCOUNTER — Other Ambulatory Visit: Payer: Self-pay | Admitting: Podiatry

## 2016-01-15 ENCOUNTER — Telehealth: Payer: Self-pay | Admitting: Podiatry

## 2016-01-15 MED ORDER — LORAZEPAM 1 MG PO TABS: 1.0000 mg | ORAL_TABLET | Freq: Two times a day (BID) | ORAL | 0 refills | Status: DC | PRN

## 2016-01-15 MED ORDER — MELOXICAM 15 MG PO TABS
15.0000 mg | ORAL_TABLET | Freq: Every day | ORAL | 3 refills | Status: DC
Start: 2016-01-15 — End: 2016-06-02

## 2016-01-15 NOTE — Telephone Encounter (Signed)
I sent it in 
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
## 2016-01-15 NOTE — Telephone Encounter (Signed)
Patient called requesting a refill on Mobic

## 2016-02-04 ENCOUNTER — Other Ambulatory Visit: Payer: Self-pay | Admitting: Family

## 2016-02-04 DIAGNOSIS — F411 Generalized anxiety disorder: Secondary | ICD-10-CM

## 2016-02-05 NOTE — Telephone Encounter (Signed)
If we are going to change a dose an office visit is going to be required as we have not seen him in 5 months. We can also consider starting a daily medication if he is having increased anxiety.

## 2016-02-08 MED ORDER — LORAZEPAM 1 MG PO TABS: 1.0000 mg | ORAL_TABLET | Freq: Two times a day (BID) | ORAL | 0 refills | Status: DC | PRN

## 2016-02-25 ENCOUNTER — Encounter: Payer: Self-pay | Admitting: Family

## 2016-02-26 ENCOUNTER — Telehealth: Payer: Self-pay | Admitting: Family

## 2016-02-26 ENCOUNTER — Encounter: Payer: Self-pay | Admitting: Family

## 2016-02-26 NOTE — Telephone Encounter (Signed)
Spoke with pt and received the working fax number. Pt is aware that the papers have been faxed.

## 2016-02-26 NOTE — Telephone Encounter (Signed)
States that fax was out of service and could not received faxed letter for patient.  Please fax today to [phone]

## 2016-02-26 NOTE — Telephone Encounter (Signed)
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   Has to say commercial vehicle. If you are ok with me saying that I can write the letter or can you write it when you can. Thanks

## 2016-02-27 ENCOUNTER — Encounter: Payer: Self-pay | Admitting: Emergency Medicine

## 2016-02-27 NOTE — Telephone Encounter (Signed)
Note written

## 2016-03-15 ENCOUNTER — Other Ambulatory Visit: Payer: Self-pay | Admitting: Family

## 2016-03-15 DIAGNOSIS — F411 Generalized anxiety disorder: Secondary | ICD-10-CM

## 2016-03-19 ENCOUNTER — Other Ambulatory Visit: Payer: Self-pay | Admitting: Family

## 2016-03-19 DIAGNOSIS — F411 Generalized anxiety disorder: Secondary | ICD-10-CM

## 2016-03-19 MED ORDER — LORAZEPAM 1 MG PO TABS: ORAL_TABLET | ORAL | 0 refills | Status: DC

## 2016-03-19 NOTE — Telephone Encounter (Signed)
Faxed script back to [facility] 

## 2016-03-31 ENCOUNTER — Ambulatory Visit: Payer: BLUE CROSS/BLUE SHIELD | Admitting: Podiatry

## 2016-04-09 ENCOUNTER — Other Ambulatory Visit: Payer: Self-pay | Admitting: Family

## 2016-04-09 DIAGNOSIS — F411 Generalized anxiety disorder: Secondary | ICD-10-CM

## 2016-04-13 ENCOUNTER — Other Ambulatory Visit: Payer: Self-pay | Admitting: Family

## 2016-04-13 DIAGNOSIS — F411 Generalized anxiety disorder: Secondary | ICD-10-CM

## 2016-04-14 MED ORDER — LORAZEPAM 1 MG PO TABS: ORAL_TABLET | ORAL | 0 refills | Status: DC

## 2016-04-14 NOTE — Telephone Encounter (Signed)
Per taylor she faxed script back to [facility]...Raechel Chute/l[facility]

## 2016-04-14 NOTE — Telephone Encounter (Signed)
Last refill was 03/19/16 

## 2016-04-14 NOTE — Telephone Encounter (Signed)
Faxed

## 2016-05-09 ENCOUNTER — Other Ambulatory Visit: Payer: Self-pay | Admitting: Family

## 2016-05-09 DIAGNOSIS — F411 Generalized anxiety disorder: Secondary | ICD-10-CM

## 2016-05-10 NOTE — Telephone Encounter (Signed)
Lmom stating that I was not able to make out the message and what medication was requesting the early fill.   Pt spouse called back and confirmed that the rq was for the lorazepam.

## 2016-05-14 ENCOUNTER — Other Ambulatory Visit: Payer: Self-pay | Admitting: Family

## 2016-05-14 DIAGNOSIS — F411 Generalized anxiety disorder: Secondary | ICD-10-CM

## 2016-05-14 MED ORDER — LORAZEPAM 1 MG PO TABS: ORAL_TABLET | ORAL | 1 refills | Status: DC

## 2016-06-02 ENCOUNTER — Other Ambulatory Visit: Payer: Self-pay | Admitting: Podiatry

## 2016-06-03 NOTE — Telephone Encounter (Signed)
Pt needs an appt prior to future refills. 

## 2016-06-12 ENCOUNTER — Encounter: Payer: Self-pay | Admitting: Family

## 2016-07-04 ENCOUNTER — Telehealth: Payer: Self-pay | Admitting: *Deleted

## 2016-07-04 MED ORDER — MELOXICAM 15 MG PO TABS: 15.0000 mg | ORAL_TABLET | Freq: Every day | ORAL | 0 refills | Status: DC

## 2016-07-04 NOTE — Telephone Encounter (Addendum)
Pt states he wants a refill of his Mobic. I told pt he had not seen Dr. Al CorpusHyatt since 09/2015 and if he was still having a problem he needed to make an appt. Pt asked if he could have his appt. I checked scheduling under his name and there was no appt. Pt states can he have the appt back he cancelled today. I told him I would transfer to schedulers to reschedule. Pt called for a partial refill of the Mobic. Left message informing pt [facility] [address] [facility] requested.

## 2016-07-04 NOTE — Addendum Note (Signed)
Addended by: Alphia Kava'CONNELL, VALERY D on: 07/04/2016 04:12 PM   Modules accepted: Orders

## 2016-07-07 ENCOUNTER — Ambulatory Visit: Payer: Self-pay | Admitting: Podiatry

## 2016-07-08 ENCOUNTER — Encounter: Payer: Self-pay | Admitting: Family

## 2016-07-08 DIAGNOSIS — F411 Generalized anxiety disorder: Secondary | ICD-10-CM

## 2016-07-09 MED ORDER — LORAZEPAM 1 MG PO TABS: ORAL_TABLET | ORAL | 0 refills | Status: DC

## 2016-07-14 ENCOUNTER — Ambulatory Visit (INDEPENDENT_AMBULATORY_CARE_PROVIDER_SITE_OTHER): Payer: BLUE CROSS/BLUE SHIELD | Admitting: Podiatry

## 2016-07-14 ENCOUNTER — Encounter: Payer: Self-pay | Admitting: Podiatry

## 2016-07-14 DIAGNOSIS — L600 Ingrowing nail: Secondary | ICD-10-CM | POA: Diagnosis not present

## 2016-07-14 MED ORDER — NEOMYCIN-POLYMYXIN-HC 1 % OT SOLN: OTIC | 1 refills | Status: DC

## 2016-07-14 MED ORDER — MELOXICAM 15 MG PO TABS: 15.0000 mg | ORAL_TABLET | Freq: Every day | ORAL | 0 refills | Status: DC

## 2016-07-14 NOTE — Progress Notes (Signed)
He presents today with chief complaint of painful toenails hallux bilateral.  Objective: Vital signs are stable he is alert and oriented 3. Pulses are palpable. Neurologic sensorium is intact. Sharp incurvated nail margin on the tibia-fibula border hallux left subungual hematoma hallux right.  Assessment: Ingrown nail paronychia hallux bilateral.  Plan: Total nail avulsion hallux right chemical matrixectomy performed to the hallux left along the tibial and fibular border without complications after anesthesia was administered he tolerated these procedures well. He was provided with both on an ongoing instructions as well as a prescription for Cortisporin Otic to be applied twice daily after soaking. Follow up with him in 2 weeks

## 2016-07-14 NOTE — Patient Instructions (Signed)

## 2016-07-28 ENCOUNTER — Encounter: Payer: Self-pay | Admitting: Podiatry

## 2016-07-28 ENCOUNTER — Ambulatory Visit (INDEPENDENT_AMBULATORY_CARE_PROVIDER_SITE_OTHER): Payer: Self-pay | Admitting: Podiatry

## 2016-07-28 DIAGNOSIS — L600 Ingrowing nail: Secondary | ICD-10-CM

## 2016-07-28 DIAGNOSIS — L03032 Cellulitis of left toe: Secondary | ICD-10-CM

## 2016-07-28 NOTE — Progress Notes (Signed)
He presents today for follow-up of matrixectomy hallux left.  Objective: No erythema edema cellulitis drainage or odor.  Assessment: Well-healing surgical toe hallux left.  Plan: Discontinue Betadine sterile doses also warm water soaks, daily vitamin bedtime. Follow-up with me in 1 month if necessary.

## 2016-07-28 NOTE — Patient Instructions (Signed)

## 2016-08-04 ENCOUNTER — Encounter: Payer: Self-pay | Admitting: Family

## 2016-08-04 ENCOUNTER — Ambulatory Visit (INDEPENDENT_AMBULATORY_CARE_PROVIDER_SITE_OTHER): Payer: PRIVATE HEALTH INSURANCE | Admitting: Family

## 2016-08-04 VITALS — BP 122/88 | HR 60 | Temp 98.3°F | Resp 16 | Ht 74.0 in | Wt 323.0 lb

## 2016-08-04 DIAGNOSIS — J302 Other seasonal allergic rhinitis: Secondary | ICD-10-CM

## 2016-08-04 DIAGNOSIS — K219 Gastro-esophageal reflux disease without esophagitis: Secondary | ICD-10-CM | POA: Diagnosis not present

## 2016-08-04 DIAGNOSIS — F411 Generalized anxiety disorder: Secondary | ICD-10-CM

## 2016-08-04 MED ORDER — PANTOPRAZOLE SODIUM 40 MG PO TBEC: 40.0000 mg | DELAYED_RELEASE_TABLET | Freq: Every day | ORAL | 1 refills | Status: DC

## 2016-08-04 MED ORDER — LEVOCETIRIZINE DIHYDROCHLORIDE 5 MG PO TABS: 5.0000 mg | ORAL_TABLET | Freq: Every evening | ORAL | 1 refills | Status: DC

## 2016-08-04 MED ORDER — LORAZEPAM 1 MG PO TABS: ORAL_TABLET | ORAL | 2 refills | Status: DC

## 2016-08-04 NOTE — Patient Instructions (Signed)
Thank you for choosing [facility].  SUMMARY AND INSTRUCTIONS:   Please continue to take your medications as prescribed.   Schedule a time for your physical at your convenience.  Medication:  Your prescription(s) have been submitted to your pharmacy or been printed and provided for you. Please take as directed and contact our office if you believe you are having problem(s) with the medication(s) or have any questions.  Follow up:  If your symptoms worsen or fail to improve, please contact our office for further instruction, or in case of emergency go directly to the emergency room at the closest medical facility.     

## 2016-08-04 NOTE — Assessment & Plan Note (Signed)
Currently maintained on alprazolam taken as needed. Symptoms are adequately controlled current medication regimen and no adverse side effects. Does not take medication within hours of driving. [place] controlled substance database reviewed with no irregularities. Continue current dosage of lorazepam.

## 2016-08-04 NOTE — Assessment & Plan Note (Signed)
Seasonal allergies previously prescribed size all and not currently maintained on medications. Change of weather has restarted symptoms. Refill Xyzal. Continue to monitor.

## 2016-08-04 NOTE — Progress Notes (Signed)
Subjective:    Patient ID: Nicholas [name], male    DOB: 29-Mar-1981, 36 y.o.   MRN: [id number]  Chief Complaint  Patient presents with  . Medication Refill    lorazepam    HPI:  Nicholas [name] is a 36 y.o. male who  has a past medical history of Anxiety; Gout; and Hypertension. and presents today for a follow up office visit.  1.) Anxiety - Currently maintained on lorazepam. Reports taking the medication as prescribed and denies adverse side effects. 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 Symptoms are generally well controlled with current medication dosage.   2.) GERD - Previously prescribed pantoprazole. Has been out of the medication for the past several months. When he takes the medication he reports that his symptoms were well controlled. Recently started having symptoms of reflux over the course of the last 2 months.   3.) Allergies - Previously prescribed Xyzal. Not currently taking the medication but notes that his symptoms were well controlled when taking the medication. Over the last couple of weeks he has had increased symptoms of runny nose, sneezing, and watery eyes.     No Known Allergies    Outpatient Medications Prior to Visit  Medication Sig Dispense Refill  . meloxicam (MOBIC) 15 MG tablet Take 1 tablet (15 mg total) by mouth daily. 15 tablet 0  . levocetirizine (XYZAL) 5 MG tablet Take 1 tablet (5 mg total) by mouth every evening. 30 tablet 11  . LORazepam (ATIVAN) 1 MG tablet Take 1 tablet by mouth daily as needed for anxiety and not within 8 hours of driving. 30 tablet 0  . NEOMYCIN-POLYMYXIN-HYDROCORTISONE (CORTISPORIN) 1 % SOLN otic solution Apply 1-2 drops to toe BID after soaking 10 mL 1  . pantoprazole (PROTONIX) 40 MG tablet Take 1 tablet (40 mg total) by mouth daily. 30 tablet 5  . Venlafaxine HCl 37.5 MG TB24 Take by mouth.     No facility-administered medications prior to visit.         Past Surgical History:  Procedure Laterality Date  . APPENDECTOMY    . CHOLECYSTECTOMY    . FOOT SURGERY    . GASTRIC BYPASS        Past Medical History:  Diagnosis Date  . Anxiety   . Gout   . Hypertension       Review of Systems  Constitutional: Negative for chills and fever.  HENT: Positive for sneezing. Negative for congestion, sinus pain, sinus pressure and sore throat.   Eyes: Positive for itching.  Respiratory: Negative for cough, chest tightness and wheezing.   Cardiovascular: Negative for chest pain, palpitations and leg swelling.  Neurological: Negative for dizziness, syncope, weakness and headaches.  Psychiatric/Behavioral: Negative for agitation, decreased concentration and sleep disturbance. The patient is not nervous/anxious.       Objective:    BP 122/88 (BP Location: Left Arm, Patient Position: Sitting, Cuff Size: Large)   Pulse 60   Temp 98.3 F (36.8 C) (Oral)   Resp 16   Ht 6\' 2"  (1.88 m)   Wt (!) 323 lb (146.5 kg)   SpO2 98%   BMI 41.47 kg/m  Nursing note and vital signs reviewed.  Physical Exam  Constitutional: He is oriented to person, place, and time. He appears well-developed and well-nourished. No distress.  Cardiovascular: Normal rate, regular rhythm, normal heart sounds and intact distal pulses.   Pulmonary/Chest: Effort normal and  breath sounds normal.  Neurological: He is alert and oriented to person, place, and time.  Skin: Skin is warm and dry.  Psychiatric: He has a normal mood and affect. His behavior is normal. Judgment and thought content normal.       Assessment & Plan:   Problem List Items Addressed This Visit      Respiratory   Seasonal allergies    Seasonal allergies previously prescribed size all and not currently maintained on medications. Change of weather has restarted symptoms. Refill Xyzal. Continue to monitor.      Relevant Medications   levocetirizine (XYZAL) 5 MG tablet     Digestive   GERD  (gastroesophageal reflux disease)    GERD previously adequately managed with pantoprazole. Patient self discontinued taking medication and completion of last refill. Symptoms have begun to restart. Restart pantoprazole. Information on GERD diet discussed. Follow-up if symptoms worsen or do not improve with medication.      Relevant Medications   pantoprazole (PROTONIX) 40 MG tablet     Other   Generalized anxiety disorder - Primary    Currently maintained on alprazolam taken as needed. Symptoms are adequately controlled current medication regimen and no adverse side effects. Does not take medication within hours of driving. [place] controlled substance database reviewed with no irregularities. Continue current dosage of lorazepam.      Relevant Medications   LORazepam (ATIVAN) 1 MG tablet       I have discontinued Nicholas [name]'s Venlafaxine HCl and NEOMYCIN-POLYMYXIN-HYDROCORTISONE. I am also having him maintain his meloxicam, pantoprazole, LORazepam, and levocetirizine.   Meds ordered this encounter  Medications  . pantoprazole (PROTONIX) 40 MG tablet    Sig: Take 1 tablet (40 mg total) by mouth daily.    Dispense:  90 tablet    Refill:  1    Order Specific Question:   Supervising Provider    Answer:   Hillard Danker A [[id number]]  . LORazepam (ATIVAN) 1 MG tablet    Sig: Take 1 tablet by mouth daily as needed for anxiety and not within 8 hours of driving.    Dispense:  30 tablet    Refill:  2    Fill no more than every 30 days. Fill on or afer 08/16/16    Order Specific Question:   Supervising Provider    Answer:   Hillard Danker A [[id number]]  . levocetirizine (XYZAL) 5 MG tablet    Sig: Take 1 tablet (5 mg total) by mouth every evening.    Dispense:  90 tablet    Refill:  1    Order Specific Question:   Supervising Provider    Answer:   Hillard Danker A [[id number]]     Follow-up: Return in about 6 months (around 02/04/2017), or if symptoms worsen or fail to  improve.  Jeanine Luz, FNP

## 2016-08-04 NOTE — Assessment & Plan Note (Deleted)
Currently maintained on alprazolam taken as needed. Symptoms are adequately controlled current medication regimen and no adverse side effects. Does not take medication within hours of driving. [place] controlled substance database reviewed with no irregularities. Continue current dosage of lorazepam. 

## 2016-08-04 NOTE — Assessment & Plan Note (Signed)
GERD previously adequately managed with pantoprazole. Patient self discontinued taking medication and completion of last refill. Symptoms have begun to restart. Restart pantoprazole. Information on GERD diet discussed. Follow-up if symptoms worsen or do not improve with medication.

## 2016-08-12 ENCOUNTER — Other Ambulatory Visit: Payer: Self-pay | Admitting: Podiatry

## 2016-08-12 ENCOUNTER — Telehealth: Payer: Self-pay | Admitting: *Deleted

## 2016-08-12 MED ORDER — CETIRIZINE HCL 10 MG PO TABS: 10.0000 mg | ORAL_TABLET | Freq: Every day | ORAL | 1 refills | Status: DC

## 2016-08-12 NOTE — Telephone Encounter (Signed)
Notified pt w/MD response. She is wanting to use her benny card and will need rx sent to pharmacy to use. Sent rx for Cetrizine...Raechel Chute

## 2016-08-12 NOTE — Telephone Encounter (Signed)
Rec'd fax stating the Levocetirizine 5 mg is on back order w/no release date. Requesting alternative. Tammy Sours is out of office pls advise...Raechel Chute

## 2016-08-12 NOTE — Telephone Encounter (Signed)
Can purchase over the counter as xyzal or alternative would be zyrtec (cetirizine) over the counter.

## 2016-08-13 ENCOUNTER — Telehealth: Payer: Self-pay | Admitting: *Deleted

## 2016-08-13 MED ORDER — MELOXICAM 15 MG PO TABS: 15.0000 mg | ORAL_TABLET | Freq: Every day | ORAL | 0 refills | Status: DC

## 2016-08-13 NOTE — Telephone Encounter (Addendum)
Pt left message with name and phone number. 08/13/2016-Left message requesting pt call again with a question, and that if he was having problems with an ingrown toenail again or other foot problems, he would need an appt. I spoke with pt and he states he would like a refill of the Mobic. I reviewed clinicals and Dr. Al Corpus ordered refill, but did not show in Medications section. I told pt that I would refill Mobic 15 mg #30, but if he was continuing to have foot/ankle pain that needed Mobic he would need an appt, because the last time he had been seen for foot or ankle pain was in 09/2015. I told pt I would see if Dr. Al Corpus would write for more refills but I had to go by the clinical notes.

## 2016-08-14 NOTE — Telephone Encounter (Signed)
Okay to refill Mobic #30 no refills. Patient will need to be seen prior to other refills of this medication.

## 2016-08-18 ENCOUNTER — Ambulatory Visit: Payer: PRIVATE HEALTH INSURANCE | Admitting: Podiatry

## 2016-08-21 ENCOUNTER — Encounter: Payer: Self-pay | Admitting: Podiatry

## 2016-08-25 ENCOUNTER — Encounter: Payer: BLUE CROSS/BLUE SHIELD | Admitting: Family

## 2016-09-15 ENCOUNTER — Ambulatory Visit (INDEPENDENT_AMBULATORY_CARE_PROVIDER_SITE_OTHER): Payer: PRIVATE HEALTH INSURANCE | Admitting: Podiatry

## 2016-09-15 ENCOUNTER — Encounter: Payer: Self-pay | Admitting: Podiatry

## 2016-09-15 DIAGNOSIS — M2142 Flat foot [pes planus] (acquired), left foot: Secondary | ICD-10-CM | POA: Diagnosis not present

## 2016-09-15 DIAGNOSIS — M2141 Flat foot [pes planus] (acquired), right foot: Secondary | ICD-10-CM

## 2016-09-15 MED ORDER — MELOXICAM 15 MG PO TABS: 15.0000 mg | ORAL_TABLET | Freq: Every day | ORAL | 3 refills | Status: DC

## 2016-09-15 NOTE — Progress Notes (Signed)
He presents today for follow-up of his plantar fasciitis states this doing just fine as long as I take meloxicam. I was told that that has been more than a year cannot have a refill on the meloxicam.  Objective: Vital signs are stable he is alert and oriented 3 he has had no changes in his past medical history medications allergy surgery social history and review of systems was checked today. He has pain on palpation medially cocaine tube was bilateral  Assessment: Plantar fascitis and pes planus bilateral.  Plan: I refilled his meloxicam. He will follow up with me on an as-needed basis.

## 2016-09-22 ENCOUNTER — Encounter: Payer: Self-pay | Admitting: Family

## 2016-09-22 ENCOUNTER — Other Ambulatory Visit (INDEPENDENT_AMBULATORY_CARE_PROVIDER_SITE_OTHER): Payer: PRIVATE HEALTH INSURANCE

## 2016-09-22 ENCOUNTER — Ambulatory Visit (INDEPENDENT_AMBULATORY_CARE_PROVIDER_SITE_OTHER): Payer: PRIVATE HEALTH INSURANCE | Admitting: Family

## 2016-09-22 VITALS — BP 126/80 | HR 66 | Temp 98.1°F | Resp 16 | Ht 74.0 in | Wt 324.8 lb

## 2016-09-22 DIAGNOSIS — Z6841 Body Mass Index (BMI) 40.0 and over, adult: Secondary | ICD-10-CM | POA: Diagnosis not present

## 2016-09-22 DIAGNOSIS — Z0000 Encounter for general adult medical examination without abnormal findings: Secondary | ICD-10-CM | POA: Diagnosis not present

## 2016-09-22 DIAGNOSIS — E669 Obesity, unspecified: Secondary | ICD-10-CM | POA: Insufficient documentation

## 2016-09-22 LAB — COMPREHENSIVE METABOLIC PANEL
ALT: 17 U/L (ref 0–53)
AST: 21 U/L (ref 0–37)
Albumin: 4.5 g/dL (ref 3.5–5.2)
Alkaline Phosphatase: 60 U/L (ref 39–117)
BUN: 16 mg/dL (ref 6–23)
CALCIUM: 9.9 mg/dL (ref 8.4–10.5)
CHLORIDE: 102 meq/L (ref 96–112)
CO2: 30 meq/L (ref 19–32)
CREATININE: 1.51 mg/dL — AB (ref 0.40–1.50)
GFR: 67.37 mL/min (ref >60.00)
Glucose, Bld: 84 mg/dL (ref 70–99)
Potassium: 4.3 mEq/L (ref 3.5–5.1)
Sodium: 139 mEq/L (ref 135–145)
Total Bilirubin: 0.8 mg/dL (ref 0.2–1.2)
Total Protein: 7.7 g/dL (ref 6.0–8.3)

## 2016-09-22 LAB — CBC
HEMATOCRIT: 43 % (ref 39.0–52.0)
HEMOGLOBIN: 14.1 g/dL (ref 13.0–17.0)
MCHC: 32.8 g/dL (ref 30.0–36.0)
MCV: 80.2 fl (ref 78.0–100.0)
PLATELETS: 217 10*3/uL (ref 150.0–400.0)
RBC: 5.37 Mil/uL (ref 4.22–5.81)
RDW: 13.6 % (ref 11.5–15.5)
WBC: 5.4 10*3/uL (ref 4.0–10.5)

## 2016-09-22 LAB — LIPID PANEL
CHOL/HDL RATIO: 4
CHOLESTEROL: 186 mg/dL (ref 0–200)
HDL: 51.9 mg/dL (ref >39.00)
LDL CALC: 117 mg/dL — AB (ref 0–99)
NonHDL: 134.33
Triglycerides: 89 mg/dL (ref 0.0–149.0)
VLDL: 17.8 mg/dL (ref 0.0–40.0)

## 2016-09-22 NOTE — Progress Notes (Signed)
Subjective:    Patient ID: Nicholas [name], male    DOB: 03/11/1981, 36 y.o.   MRN: [id number]  Chief Complaint  Patient presents with  . CPE    not fasting    HPI:  Nicholas [name] is a 36 y.o. male who presents today for an annual wellness visit.   1) Health Maintenance -   Diet - Averages about 3-4 meals per day consisting of a regular diet; Caffeine intake of about 2-3 cups per day.   Exercise - Once per week playing basketball.    2) Preventative Exams / Immunizations:  Dental -- Due for exam   Vision -- Due for exam    Health Maintenance  Topic Date Due  . HIV Screening  02/28/1995  . INFLUENZA VACCINE  12/10/2016  . TETANUS/TDAP  08/15/2022     There is no immunization history on file for this patient.   No Known Allergies   Outpatient Medications Prior to Visit  Medication Sig Dispense Refill  . levocetirizine (XYZAL) 5 MG tablet Take 1 tablet (5 mg total) by mouth every evening. 90 tablet 1  . LORazepam (ATIVAN) 1 MG tablet Take 1 tablet by mouth daily as needed for anxiety and not within 8 hours of driving. 30 tablet 2  . meloxicam (MOBIC) 15 MG tablet Take 1 tablet (15 mg total) by mouth daily. 90 tablet 3  . pantoprazole (PROTONIX) 40 MG tablet Take 1 tablet (40 mg total) by mouth daily. 90 tablet 1  . cetirizine (ZYRTEC) 10 MG tablet Take 1 tablet (10 mg total) by mouth daily. 30 tablet 1   No facility-administered medications prior to visit.      Past Medical History:  Diagnosis Date  . Anxiety   . Gout   . Hypertension      Past Surgical History:  Procedure Laterality Date  . APPENDECTOMY    . CHOLECYSTECTOMY    . FOOT SURGERY    . GASTRIC BYPASS       Family History  Problem Relation Age of Onset  . Hepatitis C Mother      Social History   Social History  . Marital status: Married    Spouse name: N/A  . Number of children: 3  . 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 Smoking status: Never Smoker  . Smokeless tobacco: Never Used  . Alcohol use Yes     Comment: occasionally  . Drug use: No  . Sexual activity: Yes   Other Topics Concern  . Not on file   Social History Narrative   Fun: Basketball, weight lifting, watch movies       Review of Systems  Constitutional: Denies fever, chills, fatigue, or significant weight gain/loss. HENT: Head: Denies headache or neck pain Ears: Denies changes in hearing, ringing in ears, earache, drainage Nose: Denies discharge, stuffiness, itching, nosebleed, sinus pain Throat: Denies sore throat, hoarseness, dry mouth, sores, thrush Eyes: Denies loss/changes in vision, pain, redness, blurry/double vision, flashing lights Cardiovascular: Denies chest pain/discomfort, tightness, palpitations, shortness of breath with activity, difficulty lying down, swelling, sudden awakening with shortness of breath Respiratory: Denies shortness of breath, cough, sputum production, wheezing Gastrointestinal: Denies dysphasia, heartburn, change in appetite, nausea, change in bowel habits, rectal bleeding, constipation, diarrhea, yellow skin or eyes Genitourinary: Denies frequency, urgency, burning/pain, blood in urine, incontinence, change in urinary strength. Musculoskeletal: Denies muscle/joint pain, stiffness, back pain, redness  or swelling of joints, trauma Skin: Denies rashes, lumps, itching, dryness, color changes, or hair/nail changes Neurological: Denies dizziness, fainting, seizures, weakness, numbness, tingling, tremor Psychiatric - Denies nervousness, stress, depression or memory loss Endocrine: Denies heat or cold intolerance, sweating, frequent urination, excessive thirst, changes in appetite Hematologic: Denies ease of bruising or bleeding     Objective:     BP 126/80 (BP Location: Left Arm, Patient Position: Sitting, Cuff Size: Large)   Pulse 66   Temp 98.1 F (36.7 C) (Oral)   Resp 16   Ht 6'  2" (1.88 m)   Wt (!) 324 lb 12.8 oz (147.3 kg)   SpO2 96%   BMI 41.70 kg/m  Nursing note and vital signs reviewed.  Physical Exam  Constitutional: He is oriented to person, place, and time. He appears well-developed and well-nourished.  HENT:  Head: Normocephalic.  Right Ear: Hearing, tympanic membrane, external ear and ear canal normal.  Left Ear: Hearing, tympanic membrane, external ear and ear canal normal.  Nose: Nose normal.  Mouth/Throat: Uvula is midline, oropharynx is clear and moist and mucous membranes are normal.  Eyes: Conjunctivae and EOM are normal. Pupils are equal, round, and reactive to light.  Neck: Neck supple. No JVD present. No tracheal deviation present. No thyromegaly present.  Cardiovascular: Normal rate, regular rhythm, normal heart sounds and intact distal pulses.   Pulmonary/Chest: Effort normal and breath sounds normal.  Abdominal: Soft. Bowel sounds are normal. He exhibits no distension and no mass. There is no tenderness. There is no rebound and no guarding.  Musculoskeletal: Normal range of motion. He exhibits no edema or tenderness.  Lymphadenopathy:    He has no cervical adenopathy.  Neurological: He is alert and oriented to person, place, and time. He has normal reflexes. No cranial nerve deficit. He exhibits normal muscle tone. Coordination normal.  Skin: Skin is warm and dry.  Psychiatric: He has a normal mood and affect. His behavior is normal. Judgment and thought content normal.       Assessment & Plan:   Problem List Items Addressed This Visit      Other   Routine adult health maintenance    1) Anticipatory Guidance: Discussed importance of wearing a seatbelt while driving and not texting while driving; changing batteries in smoke detector at least once annually; wearing suntan lotion when outside; eating a balanced and moderate diet; getting physical activity at least 30 minutes per day.  2) Immunizations / Screenings / Labs:  All  immunizations are up to date per recommendations. Due for a dental and vision exam encouraged to be completed independently. All other screenings are up to date per recommendations. Obtain CBC, CMET, and lipid profile.    Overall well exam with risk factors for cardiovascular disease including obesity. Recommend weight loss of 5-10% of current body weight through nutrition and physical activity. Goal of 30 minutes of moderate level activity daily or about 10,000 steps. Previously tested negative for sleep apnea. Continue other healthy lifestyle behaviors and choices. Follow up prevention exam in 1 year. Follow up office visit for chronic conditions.        Relevant Orders   CBC (Completed)   Comprehensive metabolic panel (Completed)   Lipid panel (Completed)   Obesity    BMI of 41. Recommend weight loss of 5-10% of current body weight. Recommend increasing physical activity to 30 minutes of moderate level activity daily. Encourage nutritional intake that focuses on nutrient dense foods and is moderate, varied, and balanced  and is low in saturated fats and processed/sugary foods. Continue to monitor.           I have discontinued Mr. Brockmann's cetirizine. I am also having him maintain his pantoprazole, LORazepam, levocetirizine, and meloxicam.   Follow-up: Return in about 6 months (around 03/25/2017), or if symptoms worsen or fail to improve.   Jeanine Luz, FNP

## 2016-09-22 NOTE — Assessment & Plan Note (Signed)
1) Anticipatory Guidance: Discussed importance of wearing a seatbelt while driving and not texting while driving; changing batteries in smoke detector at least once annually; wearing suntan lotion when outside; eating a balanced and moderate diet; getting physical activity at least 30 minutes per day.  2) Immunizations / Screenings / Labs:  All immunizations are up to date per recommendations. Due for a dental and vision exam encouraged to be completed independently. All other screenings are up to date per recommendations. Obtain CBC, CMET, and lipid profile.    Overall well exam with risk factors for cardiovascular disease including obesity. Recommend weight loss of 5-10% of current body weight through nutrition and physical activity. Goal of 30 minutes of moderate level activity daily or about 10,000 steps. Previously tested negative for sleep apnea. Continue other healthy lifestyle behaviors and choices. Follow up prevention exam in 1 year. Follow up office visit for chronic conditions.

## 2016-09-22 NOTE — Assessment & Plan Note (Signed)
BMI of 41. Recommend weight loss of 5-10% of current body weight. Recommend increasing physical activity to 30 minutes of moderate level activity daily. Encourage nutritional intake that focuses on nutrient dense foods and is moderate, varied, and balanced and is low in saturated fats and processed/sugary foods. Continue to monitor.   

## 2016-09-22 NOTE — Patient Instructions (Addendum)
Thank you for choosing [facility].  SUMMARY AND INSTRUCTIONS:  Goal is to increase physical activity to 30 minutes of moderate level activity and goal of 10,000 steps per day.  Continue to consume a nutritional intake that is moderate, balanced and varied.   Medication:  Please continue to take your medication as prescribed.   Labs:  Please stop by the lab on the lower level of the building for your blood work. Your results will be released to MyChart (or called to you) after review, usually within 72 hours after test completion. If any changes need to be made, you will be notified at that same time.  1.) The lab is open from 7:30am to 5:30 pm Monday-Friday 2.) No appointment is necessary 3.) Fasting (if needed) is 6-8 hours after food and drink; black coffee and water are okay   Follow up:  If your symptoms worsen or fail to improve, please contact our office for further instruction, or in case of emergency go directly to the emergency room at the closest medical facility.    Health Maintenance, Male A healthy lifestyle and preventive care is important for your health and wellness. Ask your health care provider about what schedule of regular examinations is right for you. What should I know about weight and diet?  Eat a Healthy Diet  Eat plenty of vegetables, fruits, whole grains, low-fat dairy products, and lean protein.  Do not eat a lot of foods high in solid fats, added sugars, or salt. Maintain a Healthy Weight  Regular exercise can help you achieve or maintain a healthy weight. You should:  Do at least 150 minutes of exercise each week. The exercise should increase your heart rate and make you sweat (moderate-intensity exercise).  Do strength-training exercises at least twice a week. Watch Your Levels of Cholesterol and Blood Lipids  Have your blood tested for lipids and cholesterol every 5 years starting at 37 years of age. If you are at high risk for heart  disease, you should start having your blood tested when you are 37 years old. You may need to have your cholesterol levels checked more often if:  Your lipid or cholesterol levels are high.  You are older than 37 years of age.  You are at high risk for heart disease. What should I know about cancer screening? Many types of cancers can be detected early and may often be prevented. Lung Cancer  You should be screened every year for lung cancer if:  You are a current smoker who has smoked for at least 30 years.  You are a former smoker who has quit within the past 15 years.  Talk to your health care provider about your screening options, when you should start screening, and how often you should be screened. Colorectal Cancer  Routine colorectal cancer screening usually begins at 37 years of age and should be repeated every 5-10 years until you are 18110 years old. You may need to be screened more often if early forms of precancerous polyps or small growths are found. Your health care provider may recommend screening at an earlier age if you have risk factors for colon cancer.  Your health care provider may recommend using home test kits to check for hidden blood in the stool.  A small camera at the end of a tube can be used to examine your colon (sigmoidoscopy or colonoscopy). This checks for the earliest forms of colorectal cancer. Prostate and Testicular Cancer  Depending on your  age and overall health, your health care provider may do certain tests to screen for prostate and testicular cancer.  Talk to your health care provider about any symptoms or concerns you have about testicular or prostate cancer. Skin Cancer  Check your skin from head to toe regularly.  Tell your health care provider about any new moles or changes in moles, especially if:  There is a change in a mole's size, shape, or color.  You have a mole that is larger than a pencil eraser.  Always use sunscreen. Apply  sunscreen liberally and repeat throughout the day.  Protect yourself by wearing long sleeves, pants, a wide-brimmed hat, and sunglasses when outside. What should I know about heart disease, diabetes, and high blood pressure?  If you are 39-32 years of age, have your blood pressure checked every 3-5 years. If you are 57 years of age or older, have your blood pressure checked every year. You should have your blood pressure measured twice-once when you are at a hospital or clinic, and once when you are not at a hospital or clinic. Record the average of the two measurements. To check your blood pressure when you are not at a hospital or clinic, you can use:  An automated blood pressure machine at a pharmacy.  A home blood pressure monitor.  Talk to your health care provider about your target blood pressure.  If you are between 78-33 years old, ask your health care provider if you should take aspirin to prevent heart disease.  Have regular diabetes screenings by checking your fasting blood sugar level.  If you are at a normal weight and have a low risk for diabetes, have this test once every three years after the age of 64.  If you are overweight and have a high risk for diabetes, consider being tested at a younger age or more often.  A one-time screening for abdominal aortic aneurysm (AAA) by ultrasound is recommended for men aged 65-75 years who are current or former smokers. What should I know about preventing infection? Hepatitis B  If you have a higher risk for hepatitis B, you should be screened for this virus. Talk with your health care provider to find out if you are at risk for hepatitis B infection. Hepatitis C  Blood testing is recommended for:  Everyone born from [birth year] through 1966.  Anyone with known risk factors for hepatitis C. Sexually Transmitted Diseases (STDs)  You should be screened each year for STDs including gonorrhea and chlamydia if:  You are sexually active and  are younger than 37 years of age.  You are older than 37 years of age and your health care provider tells you that you are at risk for this type of infection.  Your sexual activity has changed since you were last screened and you are at an increased risk for chlamydia or gonorrhea. Ask your health care provider if you are at risk.  Talk with your health care provider about whether you are at high risk of being infected with HIV. Your health care provider may recommend a prescription medicine to help prevent HIV infection. What else can I do?  Schedule regular health, dental, and eye exams.  Stay current with your vaccines (immunizations).  Do not use any tobacco products, such as cigarettes, chewing tobacco, and e-cigarettes. If you need help quitting, ask your health care provider.  Limit alcohol intake to no more than 2 drinks per day. One drink equals 12 ounces of beer,  5 ounces of wine, or 1 ounces of hard liquor.  Do not use street drugs.  Do not share needles.  Ask your health care provider for help if you need support or information about quitting drugs.  Tell your health care provider if you often feel depressed.  Tell your health care provider if you have ever been abused or do not feel safe at home. This information is not intended to replace advice given to you by your health care provider. Make sure you discuss any questions you have with your health care provider. Document Released: 10/25/2007 Document Revised: 12/26/2015 Document Reviewed: 01/30/2015 Elsevier Interactive Patient Education  2017 [employer].

## 2016-10-09 ENCOUNTER — Other Ambulatory Visit: Payer: Self-pay | Admitting: Family

## 2016-10-09 DIAGNOSIS — F411 Generalized anxiety disorder: Secondary | ICD-10-CM

## 2016-10-09 NOTE — Telephone Encounter (Signed)
Last refill was 09/15/16 per [facility] 

## 2016-10-14 NOTE — Telephone Encounter (Signed)
Faxed script back to [facility].../lmb 

## 2017-01-13 ENCOUNTER — Encounter (HOSPITAL_COMMUNITY): Payer: Self-pay | Admitting: Emergency Medicine

## 2017-01-13 ENCOUNTER — Emergency Department (HOSPITAL_COMMUNITY)
Admission: EM | Admit: 2017-01-13 | Discharge: 2017-01-13 | Disposition: A | Discharge status: Home / Self Care | Payer: BLUE CROSS/BLUE SHIELD | Attending: Emergency Medicine | Admitting: Emergency Medicine

## 2017-01-13 ENCOUNTER — Emergency Department (HOSPITAL_COMMUNITY): Payer: BLUE CROSS/BLUE SHIELD

## 2017-01-13 ENCOUNTER — Telehealth: Payer: Self-pay

## 2017-01-13 DIAGNOSIS — F411 Generalized anxiety disorder: Secondary | ICD-10-CM

## 2017-01-13 DIAGNOSIS — Z79899 Other long term (current) drug therapy: Secondary | ICD-10-CM | POA: Diagnosis not present

## 2017-01-13 DIAGNOSIS — M79644 Pain in right finger(s): Secondary | ICD-10-CM | POA: Diagnosis present

## 2017-01-13 MED ORDER — COLCHICINE 0.6 MG PO TABS
1.2000 mg | ORAL_TABLET | Freq: Once | ORAL | Status: AC
  Administered 2017-01-13: 1.2 mg via ORAL
  Filled 2017-01-13: qty 2

## 2017-01-13 MED ORDER — COLCHICINE 0.6 MG PO TABS
1.2000 mg | ORAL_TABLET | Freq: Once | ORAL | Status: DC
  Filled 2017-01-13: qty 2

## 2017-01-13 MED ORDER — IBUPROFEN 200 MG PO TABS: 600.0000 mg | ORAL_TABLET | Freq: Once | ORAL | Status: DC

## 2017-01-13 MED ORDER — COLCHICINE 0.6 MG PO TABS: 0.6000 mg | ORAL_TABLET | Freq: Every day | ORAL | 0 refills | Status: DC

## 2017-01-13 MED ORDER — HYDROCODONE-ACETAMINOPHEN 5-325 MG PO TABS: 2.0000 | ORAL_TABLET | ORAL | 0 refills | Status: DC | PRN

## 2017-01-13 NOTE — ED Provider Notes (Signed)
[facility]-EMERGENCY DEPT Provider Note   CSN: [id number] Arrival date & time: 01/13/17  0630     History   Chief Complaint Chief Complaint  Patient presents with  . Hand Pain    HPI Nicholas [name] is a 36 y.o. male.  HPI 36 year-old [place] American male past medical history significant for hypertension and gout presents to the ED today with complaints of right fifth finger pain and swelling. The patient states that this started 2 days ago has progressively worsened. Endorses warmth to his right fifth finger. Denies any injury. States he has a history of gout and this feels similar.moving makes the pain worse. Holding still makes the pain better. Patient is not taking anything for his symptoms prior to arrival. Denies any associated symptoms of fever, chills, nausea, vomiting, paresthesias, weakness, insects bites.  Past Medical History:  Diagnosis Date  . Anxiety   . Gout   . Hypertension     Patient Active Problem List   Diagnosis Date Noted  . Routine adult health maintenance 09/22/2016  . Obesity 09/22/2016  . GERD (gastroesophageal reflux disease) 08/15/2015  . Generalized anxiety disorder 07/16/2015  . Sleep disturbance 07/16/2015  . Seasonal allergies 07/16/2015  . Chronic anxiety 04/03/2014  . Adiposity 04/03/2014  . History of surgical procedure 04/03/2014    Past Surgical History:  Procedure Laterality Date  . APPENDECTOMY    . CHOLECYSTECTOMY    . FOOT SURGERY    . GASTRIC BYPASS         Home Medications    Prior to Admission medications   Medication Sig Start Date End Date Taking? Authorizing Provider  levocetirizine (XYZAL) 5 MG tablet Take 1 tablet (5 mg total) by mouth every evening. 08/04/16   Veryl Speak, FNP  LORazepam (ATIVAN) 1 MG tablet TAKE 1 TABLET BY MOUTH EVERY DAY AS NEEDED FOR ANXIETY AND NOT WITHIN 8 HOURS OF DRIVING 4/0/98   Veryl Speak, FNP  meloxicam (MOBIC) 15 MG tablet Take 1 tablet (15 mg total) by mouth daily. 09/15/16    Hyatt, Max T, DPM  pantoprazole (PROTONIX) 40 MG tablet Take 1 tablet (40 mg total) by mouth daily. 08/04/16   Veryl Speak, FNP    Family History Family History  Problem Relation Age of Onset  . Hepatitis C Mother   . Cancer Father     Social History Social History  Substance Use Topics  . Smoking status: Never Smoker  . Smokeless tobacco: Never Used  . Alcohol use Yes     Comment: occasionally     Allergies   Patient has no known allergies.   Review of Systems Review of Systems  Constitutional: Negative for chills and fever.  Gastrointestinal: Negative for nausea and vomiting.  Musculoskeletal: Positive for arthralgias, joint swelling and myalgias.  Skin: Positive for color change.  Neurological: Negative for weakness and numbness.     Physical Exam Updated Vital Signs BP 127/87 (BP Location: Left Arm)   Pulse 70   Temp 98.1 F (36.7 C) (Oral)   Resp 16   Ht 6\' 2"  (1.88 m)   Wt 136.1 kg (300 lb)   SpO2 100%   BMI 38.52 kg/m   Physical Exam  Constitutional: He appears well-developed and well-nourished. No distress.  HENT:  Head: Normocephalic and atraumatic.  Eyes: Right eye exhibits no discharge. Left eye exhibits no discharge. No scleral icterus.  Neck: Normal range of motion.  Pulmonary/Chest: No respiratory distress.  Musculoskeletal: Normal range of motion.  right hand with swelling and tenderness to palpation of 5th pip.  + swelling, erythema and warmth. Limited rom of the right 5th pip due to pain.. No TTP over flexor sheath. No erythema or warmth overlaying all other joints. There is no anatomic snuff box tenderness. Normal sensation and motor function in the median, ulnar, and radial nerve distributions. 2+ radial pulse.  Grip 5/5 strength. MCP flexion/extension intact. Finger adduction/abduction intact with 5/5 strength.  Thumb opposition intact.   Neurological: He is alert.  Skin: No pallor.  Psychiatric: His behavior is normal. Judgment  and thought content normal.  Nursing note and vitals reviewed.    ED Treatments / Results  Labs (all labs ordered are listed, but only abnormal results are displayed) Labs Reviewed - No data to display  EKG  EKG Interpretation None       Radiology Dg Hand Complete Right  Result Date: 01/13/2017 CLINICAL DATA:  PIP joint pain of the fifth finger for the past 2 days EXAM: RIGHT HAND - COMPLETE 3+ VIEW COMPARISON:  Right fifth finger radiograph of August 17, 2014 FINDINGS: The bones of the right hand are subjectively adequately mineralized. A previously described erosion along the radial aspect of the head of the proximal phalanx of the fifth finger is less conspicuous. The PIP joint space is well maintained. No proliferative changes are observed. The other phalanges as well as the metacarpals appear normal. There may be minimal soft tissue swelling over the proximal aspect of the fifth finger. IMPRESSION: No definite acute bony abnormality. A previously demonstrated erosion involving the mid radial aspect of the head of the proximal phalanx is not as conspicuous today. If further imaging is felt indicated, MRI would be the most useful modality. Electronically Signed   By: David  [place] M.D.   On: 01/13/2017 07:20    Procedures Procedures (including critical care time)  Medications Ordered in ED Medications  colchicine tablet 1.2 mg (1.2 mg Oral Given 01/13/17 0837)     Initial Impression / Assessment and Plan / ED Course  I have reviewed the triage vital signs and the nursing notes.  Pertinent labs & imaging results that were available during my care of the patient were reviewed by me and considered in my medical decision making (see chart for details).     Pt presents with monoarticular pain, swelling and erythema.  Pt is afebrile and stable. Imaging reviewed, no evidence of occult fracture or injury. Low suspicion for septic joint. Pt without known peptic ulcer disease and not  receiving concurrent treatment on warfarin. Patient currently taking Motrin. Will start patient on colchicine. Reviewed prior x-rays and seems consistent with gout.. Discussed that pt should respond to treatment with in 24 hour of begining treatment & likely resolve in 2-3 days.   Pt is hemodynamically stable, in NAD, & able to ambulate in the ED. Evaluation does not show pathology that would require ongoing emergent intervention or inpatient treatment. I explained the diagnosis to the patient. Pain has been managed & has no complaints prior to dc. Pt is comfortable with above plan and is stable for discharge at this time. All questions were answered prior to disposition. Strict return precautions for f/u to the ED were discussed. Encouraged follow up with PCP. Dicussed and seen by attending who is agreeable to the above plan.     Final Clinical Impressions(s) / ED Diagnoses   Final diagnoses:  Finger pain, right    New Prescriptions Discharge Medication List as of 01/13/2017  8:08 AM    START taking these medications   Details  colchicine 0.6 MG tablet Take 1 tablet (0.6 mg total) by mouth daily. Starting tomorrow 0.6 mg once or twice daily until flare resolves., Starting Tue 01/13/2017, Print    HYDROcodone-acetaminophen (NORCO/VICODIN) 5-325 MG tablet Take 2 tablets by mouth every 4 (four) hours as needed., Starting Tue 01/13/2017, Print         Rise MuLeaphart, Kenneth T, PA-C 01/13/17 [id number]    Shaune PollackIsaacs, Cameron, MD 01/13/17 [phone]

## 2017-01-13 NOTE — Discharge Instructions (Signed)
Take the colchicine as prescribed. Continue taking your mobic.May use the hydrocodone for refractory pain.   Please rest, ice, compress and elevated the affected body part to help with swelling and pain. If your symptoms persist or he develop fevers or worsening pain return to the ED or follow-up with her primary care doctor.  Take the colchicine starting tomorrow 1-2 times daily until flare resolves. Make srue you follow up with your pcp to have your kideny function checked regularly.

## 2017-01-13 NOTE — Telephone Encounter (Signed)
Received fax from [facility] on [address] requesting refill on lorazepam. Last refill was 12/08/16 per [facility] with 2 refills.

## 2017-01-13 NOTE — ED Notes (Signed)
Patient verbalized understanding of discharge instructions and medications. Joselyn Glassmanyler, PA at bedside to review x-ray and home instructions as well along with follow up. Patient will take given ice pack home for use.

## 2017-01-13 NOTE — ED Triage Notes (Signed)
Pt states that his 5th finger on his right hand is hurting and painful  Pt states it started on Sunday  Finger is warm to touch  Denies injury

## 2017-01-14 MED ORDER — LORAZEPAM 1 MG PO TABS: ORAL_TABLET | ORAL | 1 refills | Status: DC

## 2017-01-14 NOTE — Telephone Encounter (Signed)
Refill printed and to be faxed.

## 2017-01-15 NOTE — Telephone Encounter (Signed)
Rx refilled.

## 2017-01-19 ENCOUNTER — Emergency Department (HOSPITAL_COMMUNITY)
Admission: EM | Admit: 2017-01-19 | Discharge: 2017-01-19 | Disposition: A | Discharge status: Home / Self Care | Payer: BLUE CROSS/BLUE SHIELD | Attending: Emergency Medicine | Admitting: Emergency Medicine

## 2017-01-19 ENCOUNTER — Encounter (HOSPITAL_COMMUNITY): Payer: Self-pay | Admitting: Emergency Medicine

## 2017-01-19 DIAGNOSIS — I10 Essential (primary) hypertension: Secondary | ICD-10-CM | POA: Diagnosis not present

## 2017-01-19 DIAGNOSIS — Z79899 Other long term (current) drug therapy: Secondary | ICD-10-CM | POA: Diagnosis not present

## 2017-01-19 DIAGNOSIS — R369 Urethral discharge, unspecified: Secondary | ICD-10-CM | POA: Diagnosis not present

## 2017-01-19 DIAGNOSIS — Z202 Contact with and (suspected) exposure to infections with a predominantly sexual mode of transmission: Secondary | ICD-10-CM | POA: Diagnosis not present

## 2017-01-19 LAB — URINALYSIS, ROUTINE W REFLEX MICROSCOPIC
BILIRUBIN URINE: NEGATIVE
Bacteria, UA: NONE SEEN
Glucose, UA: NEGATIVE mg/dL
HGB URINE DIPSTICK: NEGATIVE
Ketones, ur: NEGATIVE mg/dL
NITRITE: NEGATIVE
PH: 7 (ref 5.0–8.0)
PROTEIN: NEGATIVE mg/dL
SPECIFIC GRAVITY, URINE: 1.012 (ref 1.005–1.030)
Squamous Epithelial / LPF: NONE SEEN

## 2017-01-19 MED ORDER — CEFTRIAXONE SODIUM 250 MG IJ SOLR
250.0000 mg | Freq: Once | INTRAMUSCULAR | Status: AC
  Administered 2017-01-19: 250 mg via INTRAMUSCULAR
  Filled 2017-01-19: qty 250

## 2017-01-19 MED ORDER — STERILE WATER FOR INJECTION IJ SOLN
INTRAMUSCULAR | Status: AC
  Administered 2017-01-19: 10 mL
  Filled 2017-01-19: qty 10

## 2017-01-19 MED ORDER — AZITHROMYCIN 250 MG PO TABS
1000.0000 mg | ORAL_TABLET | Freq: Once | ORAL | Status: AC
  Administered 2017-01-19: 1000 mg via ORAL
  Filled 2017-01-19: qty 4

## 2017-01-19 NOTE — Discharge Instructions (Signed)
You have been treated for Chlamydia and Gonorrhea in the Emergency Department today.  You will be contacted in the next few days about the results of your Chlamydia and Gonorrhea test today  Please go to the health department to be tested for HIV/Syphilis

## 2017-01-19 NOTE — ED Notes (Addendum)
This RN attempted blood draw for save tubes without success.

## 2017-01-19 NOTE — ED Triage Notes (Signed)
Pt comes in with complaints of a possible UTI with dysuria that he first noticed Friday. Also endorses a white discharge.  Last intercourse was Thursday. Ambulatory. A&O x4.

## 2017-01-19 NOTE — ED Provider Notes (Signed)
[facility]-EMERGENCY DEPT Provider Note   CSN: [id number] Arrival date & time: 01/19/17  1217     History   Chief Complaint Chief Complaint  Patient presents with  . UTI vs STI    HPI Nicholas [name] is a 36 y.o. male.  HPI   Nicholas [name] is a 36 year old male with a history of anxiety, arthritis who presents to the emergency department for evaluation of penile discharge, dysuria, and lower abdominal "pressure." He states that he had unprotected intercourse four days ago with a male partner. The next day he noticed increased urinary frequency, urgency and "burning" with urination. He also began to have a grey/white penile discharge which has been persistent and foul in odor. He also has some lower abdominal pressure which is worsened when he urinates. It is non-radiating, not associated with N/V or diarrhea. He denies flank pain, fever, hematuria, testicular pain.  Past Medical History:  Diagnosis Date  . Anxiety   . Gout   . Hypertension     Patient Active Problem List   Diagnosis Date Noted  . Routine adult health maintenance 09/22/2016  . Obesity 09/22/2016  . GERD (gastroesophageal reflux disease) 08/15/2015  . Generalized anxiety disorder 07/16/2015  . Sleep disturbance 07/16/2015  . Seasonal allergies 07/16/2015  . Chronic anxiety 04/03/2014  . Adiposity 04/03/2014  . History of surgical procedure 04/03/2014    Past Surgical History:  Procedure Laterality Date  . APPENDECTOMY    . CHOLECYSTECTOMY    . FOOT SURGERY    . GASTRIC BYPASS         Home Medications    Prior to Admission medications   Medication Sig Start Date End Date Taking? Authorizing Provider  colchicine 0.6 MG tablet Take 1 tablet (0.6 mg total) by mouth daily. Starting tomorrow 0.6 mg once or twice daily until flare resolves. 01/13/17  Yes Rise Mu, PA-C  HYDROcodone-acetaminophen (NORCO/VICODIN) 5-325 MG tablet Take 2 tablets by mouth every 4 (four) hours as needed. 01/13/17  Yes  Leaphart, Lynann Beaver, PA-C  LORazepam (ATIVAN) 1 MG tablet TAKE 1 TABLET BY MOUTH EVERY DAY AS NEEDED FOR ANXIETY AND NOT WITHIN 8 HOURS OF DRIVING 4/0/98  Yes Veryl Speak, FNP  meloxicam (MOBIC) 15 MG tablet Take 1 tablet (15 mg total) by mouth daily. 09/15/16  Yes Hyatt, Max T, DPM  pantoprazole (PROTONIX) 40 MG tablet Take 1 tablet (40 mg total) by mouth daily. 08/04/16  Yes Veryl Speak, FNP  levocetirizine (XYZAL) 5 MG tablet Take 1 tablet (5 mg total) by mouth every evening. Patient not taking: Reported on 01/19/2017 08/04/16   Veryl Speak, FNP    Family History Family History  Problem Relation Age of Onset  . Hepatitis C Mother   . Cancer Father     Social History Social History  Substance Use Topics  . Smoking status: Never Smoker  . Smokeless tobacco: Never Used  . Alcohol use Yes     Comment: occasionally     Allergies   Patient has no known allergies.   Review of Systems Review of Systems  Constitutional: Negative for fever.  HENT: Negative for mouth sores and sore throat.   Gastrointestinal: Positive for abdominal pain (lower abdominal "pressure" with urination). Negative for diarrhea, nausea and vomiting.  Genitourinary: Positive for discharge, dysuria, frequency and urgency. Negative for flank pain, hematuria, penile pain and testicular pain.  Skin: Negative for color change, rash and wound.  Psychiatric/Behavioral: The patient is nervous/anxious.  Physical Exam Updated Vital Signs Ht 6\' 2"  (1.88 m)   Wt 136.1 kg (300 lb)   BMI 38.52 kg/m   Physical Exam  Constitutional: He is oriented to person, place, and time. He appears well-developed and well-nourished. No distress.  HENT:  Head: Normocephalic and atraumatic.  Mouth/Throat: Oropharynx is clear and moist. No oropharyngeal exudate.  Neck: Normal range of motion. Neck supple.  Cardiovascular: Normal rate and regular rhythm.   Pulmonary/Chest: Effort normal. No respiratory distress.  He has no wheezes. He has no rales.  Abdominal: Soft. Bowel sounds are normal. There is no tenderness. There is no guarding.  No CVA tenderness  Genitourinary:  Genitourinary Comments: Uncircumcised male with white/grey discharge noted on the glans of the penis. There are no lesions on the penis or scrotum. Testes, and penis are non-tender to palpation.   Neurological: He is alert and oriented to person, place, and time. Coordination normal.  Skin: Skin is warm and dry.  Psychiatric: He has a normal mood and affect.     ED Treatments / Results  Labs (all labs ordered are listed, but only abnormal results are displayed) Labs Reviewed  URINALYSIS, ROUTINE W REFLEX MICROSCOPIC - Abnormal; Notable for the following:       Result Value   APPearance HAZY (*)    Leukocytes, UA LARGE (*)    All other components within normal limits  URINE CULTURE  GC/CHLAMYDIA PROBE AMP ([place]) NOT AT [facility]    EKG  EKG Interpretation None       Radiology No results found.  Procedures Procedures (including critical care time)  Medications Ordered in ED Medications  azithromycin (ZITHROMAX) tablet 1,000 mg (not administered)  cefTRIAXone (ROCEPHIN) injection 250 mg (not administered)     Initial Impression / Assessment and Plan / ED Course  I have reviewed the triage vital signs and the nursing notes.  Pertinent labs & imaging results that were available during my care of the patient were reviewed by me and considered in my medical decision making (see chart for details).    Patient is afebrile without abdominal tenderness, abdominal pain or painful bowel movements to indicate prostatitis.  No tenderness to palpation of the testes or epididymis to suggest orchitis or epididymitis. UA reveals many WBCs and no bacteria, patient treated prophylactically with azithromycin and Rocephin for GC/Chlamydia in the ER today .   STD cultures obtained including gonorrhea and chlamydia. Patient to be  discharged with instructions to follow up with health department for HIV/RPR testing as nursing staff was having a difficult time getting blood draw on patient. He states that he will follow up with this test in the local health department.    Discussed importance of using protection when sexually active. Pt understands that they have GC/Chlamydia cultures pending and that they will need to inform all sexual partners if results return positive.   Final Clinical Impressions(s) / ED Diagnoses   Final diagnoses:  Penile discharge    New Prescriptions New Prescriptions   No medications on file     Lawrence MarseillesShrosbree, Emily J, PA-C 01/19/17 1746    Rolland PorterJames, Mark, MD 01/26/17 [phone]

## 2017-01-19 NOTE — ED Notes (Signed)
Discharge instructions reviewed with patient. Patient verbalizes understanding. VSS.   

## 2017-01-19 NOTE — ED Notes (Signed)
This nurse attempted x2 for HIV bloodwork, but were unsuccessful. EDPA notified.

## 2017-01-20 LAB — GC/CHLAMYDIA PROBE AMP (~~LOC~~) NOT AT ARMC
Chlamydia: NEGATIVE
Neisseria Gonorrhea: POSITIVE — AB

## 2017-01-21 LAB — URINE CULTURE: CULTURE: NO GROWTH

## 2017-02-05 ENCOUNTER — Other Ambulatory Visit: Payer: Self-pay | Admitting: Family

## 2017-02-05 DIAGNOSIS — K219 Gastro-esophageal reflux disease without esophagitis: Secondary | ICD-10-CM

## 2017-03-10 ENCOUNTER — Other Ambulatory Visit: Payer: Self-pay | Admitting: Family

## 2017-03-10 DIAGNOSIS — F411 Generalized anxiety disorder: Secondary | ICD-10-CM

## 2017-03-10 NOTE — Telephone Encounter (Signed)
Pt wife called for a refill of his LORazepam (ATIVAN) 1 MG tablet  Transfer appt made for 12[facility]26 [facility]  Please advise

## 2017-03-11 NOTE — Telephone Encounter (Signed)
Check [place] registry last filled 02/19/2017. Declined to refill its too early...Raechel Chute/lmb

## 2017-03-19 NOTE — Telephone Encounter (Signed)
Pt wife called for a refill of his ATIVAN  Please advise

## 2017-03-20 ENCOUNTER — Other Ambulatory Visit: Payer: Self-pay

## 2017-03-20 DIAGNOSIS — F411 Generalized anxiety disorder: Secondary | ICD-10-CM

## 2017-03-20 MED ORDER — LORAZEPAM 1 MG PO TABS: ORAL_TABLET | ORAL | 0 refills | Status: DC

## 2017-03-20 NOTE — Telephone Encounter (Signed)
Rx has been called in to pharmacy. LVM letting pt know.

## 2017-03-20 NOTE — Telephone Encounter (Signed)
Rx has been called in. LVM letting pt know.

## 2017-04-07 ENCOUNTER — Other Ambulatory Visit: Payer: Self-pay

## 2017-04-07 ENCOUNTER — Emergency Department (HOSPITAL_COMMUNITY)
Admission: EM | Admit: 2017-04-07 | Discharge: 2017-04-07 | Disposition: A | Discharge status: Home / Self Care | Payer: BLUE CROSS/BLUE SHIELD | Attending: Emergency Medicine | Admitting: Emergency Medicine

## 2017-04-07 ENCOUNTER — Encounter (HOSPITAL_COMMUNITY): Payer: Self-pay

## 2017-04-07 DIAGNOSIS — Z79899 Other long term (current) drug therapy: Secondary | ICD-10-CM | POA: Insufficient documentation

## 2017-04-07 DIAGNOSIS — K648 Other hemorrhoids: Secondary | ICD-10-CM | POA: Diagnosis not present

## 2017-04-07 DIAGNOSIS — I129 Hypertensive chronic kidney disease with stage 1 through stage 4 chronic kidney disease, or unspecified chronic kidney disease: Secondary | ICD-10-CM | POA: Diagnosis not present

## 2017-04-07 DIAGNOSIS — K2950 Unspecified chronic gastritis without bleeding: Secondary | ICD-10-CM

## 2017-04-07 DIAGNOSIS — N189 Chronic kidney disease, unspecified: Secondary | ICD-10-CM | POA: Diagnosis not present

## 2017-04-07 DIAGNOSIS — R110 Nausea: Secondary | ICD-10-CM

## 2017-04-07 DIAGNOSIS — K625 Hemorrhage of anus and rectum: Secondary | ICD-10-CM | POA: Diagnosis not present

## 2017-04-07 DIAGNOSIS — R1030 Lower abdominal pain, unspecified: Secondary | ICD-10-CM | POA: Diagnosis not present

## 2017-04-07 DIAGNOSIS — K649 Unspecified hemorrhoids: Secondary | ICD-10-CM | POA: Diagnosis not present

## 2017-04-07 LAB — CBC
HEMATOCRIT: 40 % (ref 39.0–52.0)
Hemoglobin: 13.7 g/dL (ref 13.0–17.0)
MCH: 27.1 pg (ref 26.0–34.0)
MCHC: 34.3 g/dL (ref 30.0–36.0)
MCV: 79.1 fL (ref 78.0–100.0)
PLATELETS: 180 10*3/uL (ref 150–400)
RBC: 5.06 MIL/uL (ref 4.22–5.81)
RDW: 13.3 % (ref 11.5–15.5)
WBC: 6.2 10*3/uL (ref 4.0–10.5)

## 2017-04-07 LAB — URINALYSIS, ROUTINE W REFLEX MICROSCOPIC
BILIRUBIN URINE: NEGATIVE
GLUCOSE, UA: NEGATIVE mg/dL
Hgb urine dipstick: NEGATIVE
KETONES UR: NEGATIVE mg/dL
LEUKOCYTES UA: NEGATIVE
Nitrite: NEGATIVE
PH: 7 (ref 5.0–8.0)
PROTEIN: NEGATIVE mg/dL
Specific Gravity, Urine: 1.011 (ref 1.005–1.030)

## 2017-04-07 LAB — POC OCCULT BLOOD, ED
Fecal Occult Bld: POSITIVE — AB
Fecal Occult Bld: POSITIVE — AB

## 2017-04-07 LAB — COMPREHENSIVE METABOLIC PANEL
ALBUMIN: 4.3 g/dL (ref 3.5–5.0)
ALT: 21 U/L (ref 17–63)
AST: 31 U/L (ref 15–41)
Alkaline Phosphatase: 58 U/L (ref 38–126)
Anion gap: 6 (ref 5–15)
BILIRUBIN TOTAL: 1.1 mg/dL (ref 0.3–1.2)
BUN: 12 mg/dL (ref 6–20)
CHLORIDE: 102 mmol/L (ref 101–111)
CO2: 29 mmol/L (ref 22–32)
CREATININE: 1.52 mg/dL — AB (ref 0.61–1.24)
Calcium: 9.5 mg/dL (ref 8.9–10.3)
GFR calc Af Amer: >60 mL/min (ref >60)
GFR, EST NON AFRICAN AMERICAN: 57 mL/min — AB (ref >60)
GLUCOSE: 95 mg/dL (ref 65–99)
POTASSIUM: 3.8 mmol/L (ref 3.5–5.1)
Sodium: 137 mmol/L (ref 135–145)
TOTAL PROTEIN: 7.4 g/dL (ref 6.5–8.1)

## 2017-04-07 LAB — LIPASE, BLOOD: Lipase: 36 U/L (ref 11–51)

## 2017-04-07 MED ORDER — ONDANSETRON 4 MG PO TBDP: 4.0000 mg | ORAL_TABLET | Freq: Three times a day (TID) | ORAL | 0 refills | Status: DC | PRN

## 2017-04-07 MED ORDER — RANITIDINE HCL 150 MG PO TABS: 150.0000 mg | ORAL_TABLET | Freq: Two times a day (BID) | ORAL | 0 refills | Status: DC

## 2017-04-07 MED ORDER — HYDROCORTISONE ACETATE 25 MG RE SUPP: 25.0000 mg | Freq: Two times a day (BID) | RECTAL | 0 refills | Status: DC

## 2017-04-07 NOTE — ED Provider Notes (Signed)
[facility]-EMERGENCY DEPT Provider Note   CSN: [id number] Arrival date & time: 04/07/17  1123     History   Chief Complaint Chief Complaint  Patient presents with  . Hip Pain  . Abdominal Pain  . Hemorrhoids    HPI Nicholas [name] is a 36 y.o. male with a PMHx of GERD, HTN, gout, anxiety, obesity, and PSHx of appendectomy, cholecystectomy, and gastric bypass, who presents to the ED with one main complaint and another more chronic complaint.  His main complaint is 3 weeks of itching and burning intermittently in his rectum particularly after having BMs.  He states that usually after having a BM, he will have a few hours of itching and burning, states that it seems to be most noticeable when he is sitting or walking after the bowel movement.  Reports minimal improvement with a hemorrhoid cream and hemorrhoid wipes.  He has noticed some scant bright red blood on the hemorrhoid wipes occasionally, but denies any blood in the stool or passage of clots.  He has never had hemorrhoids before that he is aware of.  He denies any rectal trauma.  His second more chronic complaint is intermittent lower abdominal pain that occurs after eating spicy foods, which he believes is related to his "ulcer".  He states that he was told by his PCP that he had an ulcer, although he has not seen a gastroenterologist nor has he had an endoscopy.  He states that over the last 2 weeks he has noticed some minimal worsening in his symptoms, describing his pain as 6/10 intermittent nonradiating lower abdominal burning pain worse after eating spicy foods and unrelieved with his Protonix.  He admits that he sometimes misses Protonix doses.  He reports associated nausea.  His PCP is Marcos EkeGreg Calone at [facility].    He denies fevers, chills, CP, SOB, vomiting, diarrhea/constipation, obstipation, melena, hematuria, dysuria, testicular pain/swelling, penile discharge, myalgias, arthralgias, numbness, tingling,  focal weakness, or any other complaints at this time. Denies recent travel, sick contacts, suspicious food intake, EtOH use, or NSAID use.    The history is provided by the patient and medical records. No language interpreter was used.  Abdominal Pain   This is a chronic problem. The current episode started more than 1 week ago. Episode frequency: intermittently. The problem has not changed since onset.The pain is associated with eating. The pain is located in the LLQ and RLQ. The quality of the pain is burning. The pain is at a severity of 6/10. The pain is moderate. Associated symptoms include hematochezia and nausea. Pertinent negatives include fever, diarrhea, flatus, melena, vomiting, constipation, dysuria, hematuria and myalgias. The symptoms are aggravated by eating. Nothing relieves the symptoms. Past workup does not include GI consult. His past medical history is significant for GERD.    Past Medical History:  Diagnosis Date  . Anxiety   . Gout   . Hypertension     Patient Active Problem List   Diagnosis Date Noted  . Routine adult health maintenance 09/22/2016  . Obesity 09/22/2016  . GERD (gastroesophageal reflux disease) 08/15/2015  . Generalized anxiety disorder 07/16/2015  . Sleep disturbance 07/16/2015  . Seasonal allergies 07/16/2015  . Chronic anxiety 04/03/2014  . Adiposity 04/03/2014  . History of surgical procedure 04/03/2014    Past Surgical History:  Procedure Laterality Date  . APPENDECTOMY    . CHOLECYSTECTOMY    . FOOT SURGERY    . GASTRIC BYPASS  Home Medications    Prior to Admission medications   Medication Sig Start Date End Date Taking? Authorizing Provider  LORazepam (ATIVAN) 1 MG tablet TAKE 1 TABLET BY MOUTH EVERY DAY AS NEEDED FOR ANXIETY AND NOT WITHIN 8 HOURS OF DRIVING 40/01/8110/9/18  Yes Evaristo BuryShambley, Ashleigh N, NP  meloxicam (MOBIC) 15 MG tablet Take 1 tablet (15 mg total) by mouth daily. 09/15/16  Yes Hyatt, Max T, DPM  pantoprazole  (PROTONIX) 40 MG tablet TAKE 1 TABLET BY MOUTH EVERY DAY 02/05/17  Yes Veryl Speakalone, Gregory D, FNP  colchicine 0.6 MG tablet Take 1 tablet (0.6 mg total) by mouth daily. Starting tomorrow 0.6 mg once or twice daily until flare resolves. Patient not taking: Reported on 04/07/2017 01/13/17   Demetrios LollLeaphart, Kenneth T, PA-C  HYDROcodone-acetaminophen (NORCO/VICODIN) 5-325 MG tablet Take 2 tablets by mouth every 4 (four) hours as needed. Patient not taking: Reported on 04/07/2017 01/13/17   Demetrios LollLeaphart, Kenneth T, PA-C  levocetirizine (XYZAL) 5 MG tablet Take 1 tablet (5 mg total) by mouth every evening. Patient not taking: Reported on 01/19/2017 08/04/16   Veryl Speakalone, Gregory D, FNP    Family History Family History  Problem Relation Age of Onset  . Hepatitis C Mother   . Cancer Father     Social History Social History   Tobacco Use  . Smoking status: Never Smoker  . Smokeless tobacco: Never Used  Substance Use Topics  . Alcohol use: Yes    Comment: occasionally  . Drug use: No     Allergies   Patient has no known allergies.   Review of Systems Review of Systems  Constitutional: Negative for chills and fever.  Respiratory: Negative for shortness of breath.   Cardiovascular: Negative for chest pain.  Gastrointestinal: Positive for abdominal pain, anal bleeding, hematochezia, nausea and rectal pain. Negative for blood in stool, constipation, diarrhea, flatus, melena and vomiting.  Genitourinary: Negative for discharge, dysuria, hematuria, scrotal swelling and testicular pain.  Musculoskeletal: Negative for gait problem and myalgias.  Skin: Negative for color change.  Allergic/Immunologic: Negative for immunocompromised state.  Neurological: Negative for weakness and numbness.  Psychiatric/Behavioral: Negative for confusion.   All other systems reviewed and are negative for acute change except as noted in the HPI.    Physical Exam Updated Vital Signs BP (!) 159/106 (BP Location: Left Arm)   Pulse  65   Temp 98.3 F (36.8 C) (Oral)   Resp 16   Ht 6\' 2"  (1.88 m)   Wt 127 kg (280 lb)   SpO2 100%   BMI 35.95 kg/m   Physical Exam  Constitutional: He is oriented to person, place, and time. Vital signs are normal. He appears well-developed and well-nourished.  Non-toxic appearance. No distress.  Afebrile, nontoxic, NAD  HENT:  Head: Normocephalic and atraumatic.  Mouth/Throat: Oropharynx is clear and moist and mucous membranes are normal.  Eyes: Conjunctivae and EOM are normal. Right eye exhibits no discharge. Left eye exhibits no discharge.  Neck: Normal range of motion. Neck supple.  Cardiovascular: Normal rate, regular rhythm, normal heart sounds and intact distal pulses. Exam reveals no gallop and no friction rub.  No murmur heard. Pulmonary/Chest: Effort normal and breath sounds normal. No respiratory distress. He has no decreased breath sounds. He has no wheezes. He has no rhonchi. He has no rales.  Abdominal: Soft. Normal appearance and bowel sounds are normal. He exhibits no distension. There is no tenderness. There is no rigidity, no rebound, no guarding, no CVA tenderness, no tenderness at  McBurney's point and negative Murphy's sign.  Soft, NTND, +BS throughout, no r/g/r, neg murphy's, neg mcburney's, no CVA TTP   Genitourinary: Rectal exam shows external hemorrhoid, internal hemorrhoid and guaiac positive stool. Rectal exam shows no fissure, no mass, no tenderness and anal tone normal.  Genitourinary Comments: Chaperone present Exam limited due to body habitus and poor positioning of pt despite multiple attempts to reposition pt. No gross blood noted on rectal exam, normal tone, no tenderness, no mass or fissure appreciated although exam limited, +palpable internal hemorrhoid and old external hemorrhoidal skin tag noted, no thrombosed or prolapsed hemorrhoids appreciated. FOBT+ Unable to fully assess prostate due to difficult exam, but no appreciable enlargement or tenderness to  lower portion of prostate.   Musculoskeletal: Normal range of motion.  Neurological: He is alert and oriented to person, place, and time. He has normal strength. No sensory deficit.  Skin: Skin is warm, dry and intact. No rash noted.  Psychiatric: He has a normal mood and affect.  Nursing note and vitals reviewed.    ED Treatments / Results  Labs (all labs ordered are listed, but only abnormal results are displayed) Labs Reviewed  COMPREHENSIVE METABOLIC PANEL - Abnormal; Notable for the following components:      Result Value   Creatinine, Ser 1.52 (*)    GFR calc non Af Amer 57 (*)    All other components within normal limits  POC OCCULT BLOOD, ED - Abnormal; Notable for the following components:   Fecal Occult Bld POSITIVE (*)    All other components within normal limits  POC OCCULT BLOOD, ED - Abnormal; Notable for the following components:   Fecal Occult Bld POSITIVE (*)    All other components within normal limits  LIPASE, BLOOD  CBC  URINALYSIS, ROUTINE W REFLEX MICROSCOPIC    EKG  EKG Interpretation None       Radiology No results found.  Procedures Procedures (including critical care time)  Medications Ordered in ED Medications - No data to display   Initial Impression / Assessment and Plan / ED Course  I have reviewed the triage vital signs and the nursing notes.  Pertinent labs & imaging results that were available during my care of the patient were reviewed by me and considered in my medical decision making (see chart for details).     36 y.o. male here with 1 main complaint of 3wks of rectal itching/burning particularly after BMs and scant BRBPR with wiping, using hemorrhoid wipes and cream without significant relief. Secondarily reports slight worsening of chronic abd pain with eating spicy foods, no significant improvement with protonix, was told by his PCP that he has an ulcer but hasn't seen a GI specialist. On exam, no abdominal tenderness at  all, nonperitoneal exam; rectal exam difficult due to body habitus, but able to palpate small internal hemorrhoid and a hemorrhoidal skin tag is visible externally, no thrombosed or prolapsed hemorrhoids, no fissures appreciated although exam limited. Unfortunately his labs were ordered in triage and already in process, unable to cancel them; thus far, CBC WNL, U/A WNL, awaiting lipase and CMP. Sent FOBT as well. Will await labs and reassess, doubt need for imaging at this time.   3:37 PM CMP with chronic stable kidney function (Cr 1.52 today, similar to prior values), and otherwise WNL. Lipase WNL. FOBT+. For his hemorrhoids, will rx anusol, advised avoiding straining, increase fiber/water intake. For his gastritis/GERD/PUD symptoms, discussed diet/lifestyle modifications, will start on zantac/zofran, advised tylenol and avoidance/sparing use  of NSAIDs only on full stomach, discussed other OTC remedies for symptomatic relief, and f/up with PCP in 5-7 days for recheck of symptoms and ongoing evaluation/management. Advised f/up with GI in 1-2wks for ongoing evaluation and management of his complaints as well. I explained the diagnosis and have given explicit precautions to return to the ER including for any other new or worsening symptoms. The patient understands and accepts the medical plan as it's been dictated and I have answered their questions. Discharge instructions concerning home care and prescriptions have been given. The patient is STABLE and is discharged to home in good condition.      Final Clinical Impressions(s) / ED Diagnoses   Final diagnoses:  Other hemorrhoids  Chronic gastritis, presence of bleeding unspecified, unspecified gastritis type  Lower abdominal pain  Nausea  Chronic kidney disease, unspecified CKD stage  Bright red rectal bleeding    ED Discharge Orders        Ordered    hydrocortisone (ANUSOL-HC) 25 MG suppository  2 times daily     04/07/17 1500    ranitidine  (ZANTAC) 150 MG tablet  2 times daily     04/07/17 1500    ondansetron (ZOFRAN ODT) 4 MG disintegrating tablet  Every 8 hours PRN     04/07/17 [address] [place], [place] 04/07/17 1537    Lavera Guise, MD 04/07/17 [phone]

## 2017-04-07 NOTE — Discharge Instructions (Signed)
Your rectal bleeding is likely from hemorrhoids. Use anusol as directed to help treat this. Avoid straining with bowel movements, if you get constipated then use a stool softener such as colace or miralax to help with this. Increase the fiber and water intake in your diet.  Your abdominal pain is likely from gastritis or potentially an ulcer. Continue taking your home protonix as directed, do NOT miss any doses. You will need to also take zantac as directed, and avoid spicy/fatty/acidic foods, avoid soda/coffee/tea/alcohol. Avoid laying down flat within 30 minutes of eating. Avoid NSAIDs like ibuprofen/aleve/motrin/etc on an empty stomach. May consider using over the counter tums/maalox as needed for additional relief. Use zofran as directed as needed for nausea. Use tylenol as needed for pain. Follow up with your regular doctor in 5-7 days for recheck of symptoms, and you may want to also follow up with the gastroenterologist in 1-2 weeks for ongoing evaluation and management of your symptoms. Return to the ER for changes or worsening symptoms.

## 2017-04-07 NOTE — ED Triage Notes (Signed)
Patient c/o bilateral hip pain that is worse when he lies on his side x 2 weeks. Patient also c/o hemorrhoids and states that he see blood when he wipes at times Patient states he has been using medicated wipes and hemmorhoidal cream with no relief. Patient also reports mid abdominal pain that is worse when he eats spicy and greasy foods. Patient states he has been taking protonix that was prescribed with no relief.

## 2017-04-16 ENCOUNTER — Other Ambulatory Visit: Payer: Self-pay | Admitting: Internal Medicine

## 2017-04-16 DIAGNOSIS — F411 Generalized anxiety disorder: Secondary | ICD-10-CM

## 2017-05-06 ENCOUNTER — Ambulatory Visit (INDEPENDENT_AMBULATORY_CARE_PROVIDER_SITE_OTHER): Payer: BLUE CROSS/BLUE SHIELD | Admitting: Nurse Practitioner

## 2017-05-06 ENCOUNTER — Encounter: Payer: Self-pay | Admitting: Nurse Practitioner

## 2017-05-06 VITALS — BP 130/86 | HR 80 | Temp 97.7°F | Ht 74.0 in | Wt 319.0 lb

## 2017-05-06 DIAGNOSIS — M25551 Pain in right hip: Secondary | ICD-10-CM

## 2017-05-06 DIAGNOSIS — K649 Unspecified hemorrhoids: Secondary | ICD-10-CM | POA: Diagnosis not present

## 2017-05-06 DIAGNOSIS — M25552 Pain in left hip: Secondary | ICD-10-CM | POA: Diagnosis not present

## 2017-05-06 DIAGNOSIS — K2100 Gastro-esophageal reflux disease with esophagitis, without bleeding: Secondary | ICD-10-CM

## 2017-05-06 MED ORDER — PANTOPRAZOLE SODIUM 40 MG PO TBEC: 40.0000 mg | DELAYED_RELEASE_TABLET | Freq: Two times a day (BID) | ORAL | 1 refills | Status: DC

## 2017-05-06 NOTE — Progress Notes (Addendum)
Subjective:    Patient ID: Nicholas [name], male    DOB: [birth year], 36 y.o.   MRN: [id number]  HPI Nicholas [name] is a 36 yo male who presents today to establish care. He is transferring to me from another provider in the same clinic. he has the following significant medical problems: GERD, anxiety and obesity. He is here today with acute complaints of hemorrhoids, chronic complaints of GERD and hip pain.  Hemorrhoids- This is a new problem. This problem began around October. He describes the problem as rectal itching and rectal pain after bowel movements. He was actually seen in the ER for the hemorrhoids on 11/27 and given anusol which improved the itching but hes continued to have pain with bowel movements. He had lipase, CMET, CBC, urinalysis and POCT occult blood in the ER on 11/27 with the following abnormal findings: decreased renal function, stable, and positive occult blood. he's also tried colace to soften his stool with no improvement He reports constipation. He denies dizziness, weakness, malaise, nausea, vomiting, rectal bleeding.  GERD- He is maintained on protonix 40 daily He was started on zantac daily on his past ER visit, in addition to the protonix, but has not noticed any improvement. He completed the zantac course prescribed during his ER visit. His symptoms are worse after certain foods including coffee. He reports abdominal gas and bloating, belching, regurgitation.  Hip pain,  Bilateral- This problem is acute. The problem began about 2 months ago and has been intermittent since onset. Describes as a tight pain in Bilateral hips and feels like the gas and bloating from his abdomen is radiating into his hips. Denies any injuries or heavy lifting prior to onset of pain. The pain does not radiate into his back or legs. No weakness, numbness, tingling, urinary burning, increased or decreased urinary frequency. hes not tried anything for the pain at home.  Review  of Systems  See HPI  Past Medical History:  Diagnosis Date  . Anxiety   . Gout   . Hypertension      Social History   Socioeconomic History  . Marital status: Married    Spouse name: Not on file  . Number of children: 3  . Years of education: 2612  . Highest education level: Not on file  Social Needs  . Financial resource strain: Not on file  . Food insecurity - worry: Not on file  . Food insecurity - inability: Not on file  . Transportation needs - medical: Not on file  . Transportation needs - non-medical: Not on file  Occupational History  . Occupation: Truck Hospital doctorDriver  Tobacco Use  . Smoking status: Never Smoker  . Smokeless tobacco: Never Used  Substance and Sexual Activity  . Alcohol use: Yes    Comment: occasionally  . Drug use: No  . Sexual activity: Yes  Other Topics Concern  . Not on file  Social History Narrative   Fun: Basketball, weight lifting, watch movies     Past Surgical History:  Procedure Laterality Date  . APPENDECTOMY    . CHOLECYSTECTOMY    . FOOT SURGERY    . GASTRIC BYPASS      Family History  Problem Relation Age of Onset  . Hepatitis C Mother   . Cancer Father     No Known Allergies  Current Outpatient Medications on File Prior to Visit  Medication Sig Dispense Refill  . hydrocortisone (ANUSOL-HC) 25 MG suppository Place 1 suppository (25 mg total) rectally 2 (  two) times daily. For 7 days 14 suppository 0  . LORazepam (ATIVAN) 1 MG tablet TAKE 1 TABLET BY MOUTH EVERY DAY AS NEEDED FOR ANXIETY (AND NOT WITHIN 8 HOURS OF DRIVING) 30 tablet 0  . meloxicam (MOBIC) 15 MG tablet Take 1 tablet (15 mg total) by mouth daily. 90 tablet 3  . pantoprazole (PROTONIX) 40 MG tablet TAKE 1 TABLET BY MOUTH EVERY DAY 90 tablet 1  . colchicine 0.6 MG tablet Take 1 tablet (0.6 mg total) by mouth daily. Starting tomorrow 0.6 mg once or twice daily until flare resolves. (Patient not taking: Reported on 04/07/2017) 7 tablet 0  . HYDROcodone-acetaminophen  (NORCO/VICODIN) 5-325 MG tablet Take 2 tablets by mouth every 4 (four) hours as needed. (Patient not taking: Reported on 04/07/2017) 8 tablet 0  . ondansetron (ZOFRAN ODT) 4 MG disintegrating tablet Take 1 tablet (4 mg total) by mouth every 8 (eight) hours as needed for nausea or vomiting. (Patient not taking: Reported on 05/06/2017) 15 tablet 0  . ranitidine (ZANTAC) 150 MG tablet Take 1 tablet (150 mg total) by mouth 2 (two) times daily. (Patient not taking: Reported on 05/06/2017) 30 tablet 0   No current facility-administered medications on file prior to visit.     BP 130/86   Pulse 80   Temp 97.7 F (36.5 C) (Oral)   Ht 6\' 2"  (1.88 m)   Wt (!) 319 lb (144.7 kg)   SpO2 98%   BMI 40.96 kg/m        Objective:   Physical Exam  Constitutional: He is oriented to person, place, and time. He appears well-developed and well-nourished. No distress.  HENT:  Head: Normocephalic and atraumatic.  Cardiovascular: Normal rate, regular rhythm, normal heart sounds and intact distal pulses.  Pulmonary/Chest: Effort normal and breath sounds normal.  Abdominal: Soft. Bowel sounds are normal. He exhibits no distension. There is no hepatosplenomegaly. There is generalized tenderness. There is no rigidity, no rebound, no guarding and no CVA tenderness.  Musculoskeletal: Normal range of motion. He exhibits no tenderness or deformity.       Right hip: He exhibits normal range of motion, normal strength, no tenderness and no deformity.       Left hip: He exhibits normal range of motion, normal strength, no tenderness and no deformity.  Pain in bilateral hips with flexion.  Neurological: He is alert and oriented to person, place, and time. Coordination normal.  Skin: Skin is warm and dry.  Psychiatric: He has a normal mood and affect. Judgment and thought content normal.      Assessment & Plan:  RTC in 1 month for follow up of GI conditions and hip pain, or sooner if needed  Hemorrhoids, unspecified  hemorrhoid type Referrals ordered: - Ambulatory referral to Gastroenterology   Pain of both hip joints Bilateral hip pain history and physical exam not really consistent with MSK etiology. I do not think we should try NSAIDs due to uncontrolled GERD. We discussed no treatment or diagnostic testing at this time, until he is evaluated by GI for hemorrhoids and GERD, he agrees. He will RTC in 1 month, after GI visit, for follow up. .Marland Kitchen

## 2017-05-06 NOTE — Assessment & Plan Note (Signed)
Symptoms refractory to h2 BLOCKER AND PPI therapy. Increase protonix to 40 BID while awaiting GI referral. Discussed food triggers and lifestyle management. Pt education handout provided Referrals ordered: - Ambulatory referral to Gastroenterology Medications ordered: - pantoprazole (PROTONIX) 40 MG tablet; Take 1 tablet (40 mg total) by mouth 2 (two) times daily.  Dispense: 180 tablet; Refill: 1

## 2017-05-06 NOTE — Patient Instructions (Signed)
I have placed a referral to gastroenterology to help with your hemorrhoids and GERD. Our office will call you to schedule this appointment. You should hear from our office in 7-10 days. You can increase your protonix to 40mg  twice daily until you see the gastroenterologist, to help control your symptoms. Please try to avoid foods that make your symptoms worse.  Id like to see you back in about 1 month, after you see the gastroenterologist, to see how your hips are doing and how things are going overall.  It was nice to meet you. Thanks for letting me take care of you today :)  Food Choices for Gastroesophageal Reflux Disease, Adult When you have gastroesophageal reflux disease (GERD), the foods you eat and your eating habits are very important. Choosing the right foods can help ease your discomfort. What guidelines do I need to follow?  Choose fruits, vegetables, whole grains, and low-fat dairy products.  Choose low-fat meat, fish, and poultry.  Limit fats such as oils, salad dressings, butter, nuts, and avocado.  Keep a food diary. This helps you identify foods that cause symptoms.  Avoid foods that cause symptoms. These may be different for everyone.  Eat small meals often instead of 3 large meals a day.  Eat your meals slowly, in a place where you are relaxed.  Limit fried foods.  Cook foods using methods other than frying.  Avoid drinking alcohol.  Avoid drinking large amounts of liquids with your meals.  Avoid bending over or lying down until 2-3 hours after eating. What foods are not recommended? These are some foods and drinks that may make your symptoms worse: Vegetables Tomatoes. Tomato juice. Tomato and spaghetti sauce. Chili peppers. Onion and garlic. Horseradish. Fruits Oranges, grapefruit, and lemon (fruit and juice). Meats High-fat meats, fish, and poultry. This includes hot dogs, ribs, ham, sausage, salami, and bacon. Dairy Whole milk and chocolate milk.  Sour cream. Cream. Butter. Ice cream. Cream cheese. Drinks Coffee and tea. Bubbly (carbonated) drinks or energy drinks. Condiments Hot sauce. Barbecue sauce. Sweets/Desserts Chocolate and cocoa. Donuts. Peppermint and spearmint. Fats and Oils High-fat foods. This includes Jamaica[place] fries and potato chips. Other Vinegar. Strong spices. This includes black pepper, white pepper, red pepper, cayenne, curry powder, cloves, ginger, and chili powder. The items listed above may not be a complete list of foods and drinks to avoid. Contact your dietitian for more information. This information is not intended to replace advice given to you by your health care provider. Make sure you discuss any questions you have with your health care provider. Document Released: 10/28/2011 Document Revised: 10/04/2015 Document Reviewed: 03/02/2013 Elsevier Interactive Patient Education  2017 [employer]

## 2017-05-08 ENCOUNTER — Encounter: Payer: Self-pay | Admitting: Gastroenterology

## 2017-05-19 ENCOUNTER — Other Ambulatory Visit: Payer: Self-pay | Admitting: Internal Medicine

## 2017-05-19 DIAGNOSIS — F411 Generalized anxiety disorder: Secondary | ICD-10-CM

## 2017-05-20 NOTE — Telephone Encounter (Signed)
I sent one month refill of ativan for him. Can he please schedule OV to discuss anxiety prior to additional refills.

## 2017-05-21 ENCOUNTER — Ambulatory Visit: Payer: BLUE CROSS/BLUE SHIELD | Admitting: Gastroenterology

## 2017-05-22 NOTE — Telephone Encounter (Signed)
Pls call pt and make f/u appt on mediation ativan per Morrie SheldonAshley...Raechel Chute/lmb

## 2017-05-22 NOTE — Telephone Encounter (Signed)
LVM to inform patient he needs to set up a OV to come see [facility] discuss. You may set this up using one of the same day spots.

## 2017-05-27 ENCOUNTER — Encounter: Payer: Self-pay | Admitting: Physician Assistant

## 2017-05-27 ENCOUNTER — Encounter (INDEPENDENT_AMBULATORY_CARE_PROVIDER_SITE_OTHER): Payer: Self-pay

## 2017-05-27 ENCOUNTER — Ambulatory Visit (INDEPENDENT_AMBULATORY_CARE_PROVIDER_SITE_OTHER): Payer: BLUE CROSS/BLUE SHIELD | Admitting: Physician Assistant

## 2017-05-27 VITALS — BP 118/82 | HR 68 | Ht 72.0 in | Wt 316.0 lb

## 2017-05-27 DIAGNOSIS — K2100 Gastro-esophageal reflux disease with esophagitis, without bleeding: Secondary | ICD-10-CM

## 2017-05-27 DIAGNOSIS — Z9884 Bariatric surgery status: Secondary | ICD-10-CM | POA: Diagnosis not present

## 2017-05-27 DIAGNOSIS — K602 Anal fissure, unspecified: Secondary | ICD-10-CM | POA: Diagnosis not present

## 2017-05-27 DIAGNOSIS — K625 Hemorrhage of anus and rectum: Secondary | ICD-10-CM | POA: Diagnosis not present

## 2017-05-27 DIAGNOSIS — R1013 Epigastric pain: Secondary | ICD-10-CM | POA: Diagnosis not present

## 2017-05-27 MED ORDER — PEG-KCL-NACL-NASULF-NA ASC-C 140 G PO SOLR: 1.0000 | ORAL | 0 refills | Status: DC

## 2017-05-27 MED ORDER — AMBULATORY NON FORMULARY MEDICATION: 1 refills | Status: DC

## 2017-05-27 NOTE — Patient Instructions (Signed)
We have sent a prescription for nitroglycerin 0.125% gel to [facility]. You should apply a pea size amount to your rectum three times daily x 6-8 weeks.  [facility]'s information is below: Address: [address], [place], [place] [address]  [phone]  Please purchase the following medications over the counter and take as directed: Reticare with lidocaine and use as needed for rectal pain.     You can try sitz baths 3-4 times a day for 15-20 minutes.   Continue Pantoprazole 40 mg twice a day.   We have given you a GERD handout.   You have been scheduled for an endoscopy and colonoscopy. Please follow the written instructions given to you at your visit today. Please pick up your prep supplies at the pharmacy within the next 1-3 days. If you use inhalers (even only as needed), please bring them with you on the day of your procedure. Your physician has requested that you go to www.startemmi.com and enter the access code given to you at your visit today. This web site gives a general overview about your procedure. However, you should still follow specific instructions given to you by our office regarding your preparation for the procedure.  Disposable Sitz Bath A disposable sitz bath is a plastic basin that fits over the toilet. A bag is hung above the toilet, and the bag is connected to a tube that opens into the basin. The bag is filled with warm water that flows into the basin through the tube. A sitz bath can be used to help relieve symptoms, clean, and promote healing in the genital and anal areas, as well as in the lower abdomen and buttocks. What are the risks? Sitz baths are generally very safe. It is possible for the skin between the genitals and the anus (perineum) to become infected, but this is rare. You can avoid this by cleaning your sitz bath supplies thoroughly. How to use a disposable sitz bath 1. Close the clamp on the tube. Make sure the clamp is  closed tightly to prevent leakage. 2. Fill the sitz bath basin and the plastic bag with warm water. The water should be warm enough to be comfortable, but not hot. 3. Raise the toilet seat and place the filled basin on the toilet. Make sure the overflow opening is facing toward the back of the toilet. ? If you prefer, you may place the empty basin on the toilet first, and then use the plastic bag to fill the basin with warm water. 4. Hang the filled plastic bag overhead on a hook or towel rack close to the toilet. The bag should be higher than the toilet so that the water will flow down through the tube. 5. Attach the tube to the opening on the basin. Make sure that the tube is attached to the basin tightly to prevent leakage. 6. Sit on the basin and release the clamp. This will allow warm water to flow into the basin and flush the area around your genitals and anus. 7. Remain sitting on the basin for about 15-20 minutes, or as long as told by your health care provider. 8. Stand up and gently pat your skin dry. If directed, apply clean bandages (dressings) to the affected area as told by your health care provider. 9. Carefully remove the basin from the toilet seat and tip the basin into the toilet to empty any remaining water. Empty any remaining water from the plastic bag into the toilet.  Then, flush the toilet. 10. Wash the basin with warm water and soap. Let the basin air dry in the sink. You should also let the plastic bag and the tubing air dry. 11. Store the basin, tubing, and plastic bag in a clean, dry area. 12. Wash your hands with soap and water. If soap and water are not available, use hand sanitizer. Contact a health care provider if:  You have symptoms that get worse instead of better.  You develop new skin irritation, redness, or swelling around your genitals or anus. This information is not intended to replace advice given to you by your health care provider. Make sure you discuss  any questions you have with your health care provider. Document Released: 10/28/2011 Document Revised: 10/04/2015 Document Reviewed: 03/18/2015 Elsevier Interactive Patient Education  [employer]2018 Elsevier Inc.

## 2017-05-27 NOTE — Progress Notes (Signed)
Assessment and plans reviewed  

## 2017-05-27 NOTE — Progress Notes (Signed)
Chief Complaint: Epigastric abdominal pain, gas, hemorrhoids and rectal bleeding  HPI:    Mr. Nicholas [name] is a 37 year old AA male with a past medical history of anxiety, depression and others listed below, who was referred to me by Evaristo Bury, NP for a complaint of abdominal pain, gas, hemorrhoids and rectal bleeding.      Patient was seen in the ED 04/07/17 for a complaint of itching and burning intermittently in his rectum particularly after bowel movements as well as intermittent lower abdominal pain that occurs after eating spicy foods.  Patient had rectal exam which was noted to be limited due to body habitus.  He had palpable internal hemorrhoid and old external hemorrhoidal skin tag as well as FOBT positive stool.  Labs showed a CMP with minimally elevated creatinine at 1.52 as well as normal lipase and CBC.  Patient was prescribed Anusol.  He was started on Zantac and Zofran.    Patient saw primary care provider 05/06/17 for complaint of hemorrhoids which were no better.  He also described reflux irregardless of Pantoprazole 40 mg daily as well as Zantac daily.    Today, the patient tells me that he has had reflux for "many years".  He expresses that he had a gastric bypass about 6 years ago and remembers having reflux even prior to this procedure.  Currently, the patient tell me that whenever he drinks coffee or "eats the wrong thing" he will burp and have a nauseous feeling and over the past few months has developed an epigastric/right upper quadrant pain.  The patient rates this as a 6-7/10 when it occurs and it can sometimes be very sharp in nature.  It is typically relieved after 30-60 minutes.  Patient tells me he is taking his Pantoprazole 40 mg twice a day and this seems to help for a couple of hours but then he feels as though it "wears off".  Patient describes being on Zantac in the past as prescribed by the ER but did not feel that this helped at all.  Associated symptoms include  feeling "gassy all day" and "full/bloated".    Patient also describes trouble with rectal bleeding and itching in the past 3 months.  He describes having 1 daily "larger than normal bowel movement" over this period of time.  He describes seeing bright red blood on the toilet paper after wiping.  He also describes occasional rectal itching.  Initially this would go away about 2 hours after having a bowel movement and "irritating things", but over the past month or so this now hurts constantly and is worse when he has a bowel movement, wipes or just sits.  He describes this as a feeling of "sitting on glass".  The patient did try suppositories for a short period of time but did not feel as though these were helping and stopped them.    Patient denies fever, chills, melena, weight loss, anorexia, family history of IBD or colon cancer.  Past Medical History:  Diagnosis Date  . Anxiety   . Arthritis   . Depression   . Gout   . Hypertension     Past Surgical History:  Procedure Laterality Date  . APPENDECTOMY    . CHOLECYSTECTOMY    . FOOT SURGERY Left   . GASTRIC BYPASS      Current Outpatient Medications  Medication Sig Dispense Refill  . LORazepam (ATIVAN) 1 MG tablet TAKE 1 TABLET BY MOUTH EVERY DAY AS NEEDED FOR ANXIETY (AND NOT  WITHIN 8 HOURS OF DRIVING) 30 tablet 0  . meloxicam (MOBIC) 15 MG tablet Take 1 tablet (15 mg total) by mouth daily. 90 tablet 3  . pantoprazole (PROTONIX) 40 MG tablet Take 1 tablet (40 mg total) by mouth 2 (two) times daily. 180 tablet 1  . colchicine 0.6 MG tablet Take 1 tablet (0.6 mg total) by mouth daily. Starting tomorrow 0.6 mg once or twice daily until flare resolves. (Patient not taking: Reported on 04/07/2017) 7 tablet 0   No current facility-administered medications for this visit.     Allergies as of 05/27/2017  . (No Known Allergies)    Family History  Problem Relation Age of Onset  . Hepatitis C Mother   . Bone cancer Father   . Pancreatic  cancer Paternal Uncle     Social History   Socioeconomic History  . Marital status: Married    Spouse name: Not on file  . Number of children: 3  . Years of education: 65  . Highest education level: Not on file  Social Needs  . Financial resource strain: Not on file  . Food insecurity - worry: Not on file  . Food insecurity - inability: Not on file  . Transportation needs - medical: Not on file  . Transportation needs - non-medical: Not on file  Occupational History  . Occupation: Truck Hospital doctor  Tobacco Use  . Smoking status: Never Smoker  . Smokeless tobacco: Never Used  Substance and Sexual Activity  . Alcohol use: Yes    Comment: occasionally  . Drug use: No  . Sexual activity: Yes  Other Topics Concern  . Not on file  Social History Narrative   Fun: Basketball, weight lifting, watch movies     Review of Systems:    Constitutional: No weight loss, fever or chills Skin: No rash  Cardiovascular: No chest pain Respiratory: No SOB  Gastrointestinal: See HPI and otherwise negative Genitourinary: No dysuria Neurological: No headache, dizziness or syncope Musculoskeletal: No new muscle or joint pain Hematologic: No bruising Psychiatric: No history of depression or anxiety    Physical Exam:  Vital signs: BP 118/82 (BP Location: Left Arm, Patient Position: Sitting, Cuff Size: Large)   Pulse 68   Ht 6' (1.829 m) Comment: height measured without shoes  Wt (!) 316 lb (143.3 kg)   BMI 42.86 kg/m   Constitutional:   Pleasant overweight AA male appears to be in NAD, Well developed, Well nourished, alert and cooperative Head:  Normocephalic and atraumatic. Eyes:   PEERL, EOMI. No icterus. Conjunctiva pink. Ears:  Normal auditory acuity. Neck:  Supple Throat: Oral cavity and pharynx without inflammation, swelling or lesion.  Respiratory: Respirations even and unlabored. Lungs clear to auscultation bilaterally.   No wheezes, crackles, or rhonchi.  Cardiovascular: Normal  S1, S2. No MRG. Regular rate and rhythm. No peripheral edema, cyanosis or pallor.  Gastrointestinal:  Soft, nondistended, nontender. No rebound or guarding. Normal bowel sounds. No appreciable masses or hepatomegaly. Rectal:  (technically difficult due to body habitus) External exam: visible hemorrhoid tag; Internal exam: ttp, brown stool; Anoscopy: Limited, but visible anterior fissure with ttp and stigmata of bleeding Msk:  Symmetrical without gross deformities. Without edema, no deformity or joint abnormality.  Neurologic:  Alert and  oriented x4;  grossly normal neurologically.  Skin:   Dry and intact without significant lesions or rashes. Psychiatric:  Demonstrates good judgement and reason without abnormal affect or behaviors.  RELEVANT LABS AND IMAGING: CBC    Component Value  Date/Time   WBC 6.2 04/07/2017 1350   RBC 5.06 04/07/2017 1350   HGB 13.7 04/07/2017 1350   HGB 13.5 08/17/2014 2155   HCT 40.0 04/07/2017 1350   HCT 40.9 08/17/2014 2155   PLT 180 04/07/2017 1350   PLT 188 08/17/2014 2155   MCV 79.1 04/07/2017 1350   MCV 80 08/17/2014 2155   MCH 27.1 04/07/2017 1350   MCHC 34.3 04/07/2017 1350   RDW 13.3 04/07/2017 1350   RDW 13.8 08/17/2014 2155   LYMPHSABS 2.5 08/17/2014 2155   MONOABS 0.6 08/17/2014 2155   EOSABS 0.2 08/17/2014 2155   BASOSABS 0.0 08/17/2014 2155    CMP     Component Value Date/Time   NA 137 04/07/2017 1350   NA 140 08/17/2014 2155   K 3.8 04/07/2017 1350   K 3.8 08/17/2014 2155   CL 102 04/07/2017 1350   CL 107 08/17/2014 2155   CO2 29 04/07/2017 1350   CO2 27 08/17/2014 2155   GLUCOSE 95 04/07/2017 1350   GLUCOSE 91 08/17/2014 2155   BUN 12 04/07/2017 1350   BUN 12 08/17/2014 2155   CREATININE 1.52 (H) 04/07/2017 1350   CREATININE 1.23 08/17/2014 2155   CALCIUM 9.5 04/07/2017 1350   CALCIUM 9.0 08/17/2014 2155   PROT 7.4 04/07/2017 1350   PROT 8.0 11/14/2012 0049   ALBUMIN 4.3 04/07/2017 1350   ALBUMIN 3.9 11/14/2012 0049   AST  31 04/07/2017 1350   AST 19 11/14/2012 0049   ALT 21 04/07/2017 1350   ALT 18 11/14/2012 0049   ALKPHOS 58 04/07/2017 1350   ALKPHOS 100 11/14/2012 0049   BILITOT 1.1 04/07/2017 1350   BILITOT 1.0 11/14/2012 0049   GFRNONAA 57 (L) 04/07/2017 1350   GFRNONAA >60 08/17/2014 2155   GFRAA >60 04/07/2017 1350   GFRAA >60 08/17/2014 2155    Assessment: 1.  GERD: Chronic for the patient, minimally better on Pantoprazole 40 mg twice daily, no help from an H2 blocker in the past 2.  Epigastric pain: Now constant, worse with eating; consider PUD versus anastomotic ulcer versus other 2.  History of gastric bypass: About 6 years ago per the patient 4.  Anal fissure: New for the patient, anterior fissure on exam which was tender to palpation 5.  Rectal bleeding: Likely due to above  Plan: 1.  Prescribed Nitroglycerin ointment to be applied to rectum 3 times daily for 6-8 weeks. 2.  Would recommend applying direct care cream with lidocaine as needed for rectal discomfort. 3.  Recommended sitz bath's 2-3 times a day for 15-20 minutes at a time. 4.  Patient to continue Pantoprazole 40 mg twice daily, 30-60 minutes before eating.  He feels as though this works better than any other medications he has been on in the past and did not see a benefit from an H2 blocker in the past. 5.  Scheduled patient for an EGD and colonoscopy for further evaluation.  Did discuss risk, benefits, limitations and alternatives and the patient agrees to proceed.  This is [facility] with [facility]Perry [facility]. 6.  Reviewed antireflux diet and lifestyle modifications.  Provided the patient with a handout. 7.  Patient to follow in clinic per recommendations from Dr. Marina GoodellPerry after time of procedures.  Hyacinth MeekerJennifer Adrion Menz, PA-C [facility] 05/27/2017, 2:11 PM  Cc: Evaristo BuryShambley, Ashleigh N, NP

## 2017-06-08 ENCOUNTER — Other Ambulatory Visit (INDEPENDENT_AMBULATORY_CARE_PROVIDER_SITE_OTHER): Payer: BLUE CROSS/BLUE SHIELD

## 2017-06-08 ENCOUNTER — Ambulatory Visit (INDEPENDENT_AMBULATORY_CARE_PROVIDER_SITE_OTHER): Payer: BLUE CROSS/BLUE SHIELD | Admitting: Family

## 2017-06-08 ENCOUNTER — Ambulatory Visit: Payer: BLUE CROSS/BLUE SHIELD | Admitting: Nurse Practitioner

## 2017-06-08 ENCOUNTER — Encounter: Payer: Self-pay | Admitting: Family

## 2017-06-08 VITALS — BP 130/90 | HR 58 | Temp 97.8°F | Wt 316.0 lb

## 2017-06-08 DIAGNOSIS — R200 Anesthesia of skin: Secondary | ICD-10-CM | POA: Diagnosis not present

## 2017-06-08 DIAGNOSIS — R350 Frequency of micturition: Secondary | ICD-10-CM | POA: Diagnosis not present

## 2017-06-08 DIAGNOSIS — R202 Paresthesia of skin: Secondary | ICD-10-CM

## 2017-06-08 DIAGNOSIS — M109 Gout, unspecified: Secondary | ICD-10-CM | POA: Diagnosis not present

## 2017-06-08 DIAGNOSIS — R030 Elevated blood-pressure reading, without diagnosis of hypertension: Secondary | ICD-10-CM | POA: Diagnosis not present

## 2017-06-08 LAB — URINALYSIS
BILIRUBIN URINE: NEGATIVE
Hgb urine dipstick: NEGATIVE
Ketones, ur: NEGATIVE
Leukocytes, UA: NEGATIVE
NITRITE: NEGATIVE
PH: 6 (ref 5.0–8.0)
Specific Gravity, Urine: 1.015 (ref 1.000–1.030)
TOTAL PROTEIN, URINE-UPE24: NEGATIVE
URINE GLUCOSE: NEGATIVE
Urobilinogen, UA: 0.2 (ref 0.0–1.0)

## 2017-06-08 LAB — URIC ACID: Uric Acid, Serum: 8.1 mg/dL — ABNORMAL HIGH (ref 4.0–7.8)

## 2017-06-08 LAB — MAGNESIUM: Magnesium: 1.7 mg/dL (ref 1.5–2.5)

## 2017-06-08 LAB — TSH: TSH: 3.25 u[IU]/mL (ref 0.35–4.50)

## 2017-06-08 LAB — VITAMIN B12: VITAMIN B 12: 464 pg/mL (ref 211–911)

## 2017-06-08 LAB — HEMOGLOBIN A1C: HEMOGLOBIN A1C: 5.2 % (ref 4.6–6.5)

## 2017-06-08 LAB — PSA: PSA: 0.25 ng/mL (ref 0.10–4.00)

## 2017-06-08 MED ORDER — CIPROFLOXACIN HCL 500 MG PO TABS: 500.0000 mg | ORAL_TABLET | Freq: Two times a day (BID) | ORAL | 0 refills | Status: DC

## 2017-06-08 NOTE — Progress Notes (Signed)
Nicholas [name] is a 37 y.o. male with the following history as recorded in Epic[facility]:  Patient Active Problem List   Diagnosis Date Noted  . Routine adult health maintenance 09/22/2016  . Obesity 09/22/2016  . GERD (gastroesophageal reflux disease) 08/15/2015  . Generalized anxiety disorder 07/16/2015  . Sleep disturbance 07/16/2015  . Seasonal allergies 07/16/2015    Current Outpatient Medications  Medication Sig Dispense Refill  . AMBULATORY NON FORMULARY MEDICATION Medication Name: Nitroglycerin ointment 0.125 mg mix with 5 % lidocaine. Three times a day for the next 6-8 weeks. 30 g 1  . colchicine 0.6 MG tablet Take 1 tablet (0.6 mg total) by mouth daily. Starting tomorrow 0.6 mg once or twice daily until flare resolves. 7 tablet 0  . LORazepam (ATIVAN) 1 MG tablet TAKE 1 TABLET BY MOUTH EVERY DAY AS NEEDED FOR ANXIETY (AND NOT WITHIN 8 HOURS OF DRIVING) 30 tablet 0  . meloxicam (MOBIC) 15 MG tablet Take 1 tablet (15 mg total) by mouth daily. 90 tablet 3  . pantoprazole (PROTONIX) 40 MG tablet Take 1 tablet (40 mg total) by mouth 2 (two) times daily. 180 tablet 1  . PEG-KCl-NaCl-NaSulf-Na Asc-C (PLENVU) 140 g SOLR Take 1 kit by mouth as directed. 1 each 0  . ciprofloxacin (CIPRO) 500 MG tablet Take 1 tablet (500 mg total) by mouth 2 (two) times daily. 28 tablet 0   No current facility-administered medications for this visit.     Allergies: Patient has no known allergies.  Past Medical History:  Diagnosis Date  . Anxiety   . Arthritis   . Depression   . Gout   . Hypertension     Past Surgical History:  Procedure Laterality Date  . APPENDECTOMY    . CHOLECYSTECTOMY    . FOOT SURGERY Left   . GASTRIC BYPASS      Family History  Problem Relation Age of Onset  . Hepatitis C Mother   . Bone cancer Father   . Pancreatic cancer Paternal Uncle     Social History   Tobacco Use  . Smoking status: Never Smoker  . Smokeless tobacco: Never Used  Substance Use Topics  .  Alcohol use: Yes    Comment: occasionally    Subjective:  Patient complains of urinary frequency x 3 weeks; denies any burning on urination; denies any pain or blood in urine; feels like emptying bladder completely; does note that drinking more water recently- replacing the soda I was drinking with more water; mentions that he is having some scrotal discomfort recently; had episode of discomfort with recent intercourse.  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  Head: Normocephalic and atraumatic   bilaterally without wheeze, rales, rhonchi  CVS exam: normal rate and regular rhythm.  Musculoskeletal: No deformities; no active joint inflammation  Extremities:  No edema, cyanosis, clubbing  Vessels: Symmetric bilaterally  Neurologic: Alert and oriented; speech intact; face symmetrical; moves all extremities well; CNII-XII intact without focal deficit  Assessment:  1. Urinary frequency   2. Numbness and tingling   3. Gout, unspecified cause, unspecified chronicity, unspecified site   4. Elevated blood pressure reading     Plan:  1. Suspect prostatitis; check U/A, urine culture, G/C urine and PSA today; start on Cipro 500 mg bid x 14 days; follow-up to be determined; 2. Check B12, TSH, magnesium; 3. Check uric acid level today; 4. Patient is encouraged to monitor blood pressure regularly and  follow-up if consistently above 140/90. He admits he was nervous today because he is concerned he had diabetes.   No Follow-up on file.  Orders Placed This Encounter  Procedures  . Urine Culture    Standing Status:   Future    Number of Occurrences:   1    Standing Expiration Date:   06/08/2018  . Urinalysis    Standing Status:   Future    Number of Occurrences:   1    Standing Expiration Date:   06/08/2018  . PSA    Standing Status:   Future    Number of Occurrences:   1    Standing Expiration Date:   06/08/2018  . HgB A1c    Standing Status:   Future    Number of Occurrences:   1    Standing Expiration Date:   06/08/2018  . GC Probe amplification, urine    Standing Status:   Future    Standing Expiration Date:   06/08/2018  . TSH    Standing Status:   Future    Number of Occurrences:   1    Standing Expiration Date:   06/08/2018  . B12    Standing Status:   Future    Number of Occurrences:   1    Standing Expiration Date:   06/08/2018  . Magnesium    Standing Status:   Future    Number of Occurrences:   1    Standing Expiration Date:   06/08/2018  . Uric acid    Standing Status:   Future    Number of Occurrences:   1    Standing Expiration Date:   06/08/2018    Requested Prescriptions   Signed Prescriptions Disp Refills  . ciprofloxacin (CIPRO) 500 MG tablet 28 tablet 0    Sig: Take 1 tablet (500 mg total) by mouth 2 (two) times daily.

## 2017-06-08 NOTE — Patient Instructions (Signed)
Prostatitis Prostatitis is swelling of the prostate gland. The prostate helps to make semen. It is below a man's bladder, in front of the rectum. There are different types of prostatitis. Follow these instructions at home:  Take over-the-counter and prescription medicines only as told by your doctor.  If you were prescribed an antibiotic medicine, take it as told by your doctor. Do not stop taking the antibiotic even if you start to feel better.  If your doctor prescribed exercises, do them as directed.  Take sitz baths as told by your doctor. To take a sitz bath, sit in warm water that is deep enough to cover your hips and butt.  Keep all follow-up visits as told by your doctor. This is important. Contact a doctor if:  Your symptoms get worse.  You have a fever. Get help right away if:  You have chills.  You feel sick to your stomach (nauseous).  You throw up (vomit).  You feel light-headed.  You feel like you might pass out (faint).  You cannot pee (urinate).  You have blood or clumps of blood (blood clots) in your pee (urine). This information is not intended to replace advice given to you by your health care provider. Make sure you discuss any questions you have with your health care provider. Document Released: 10/28/2011 Document Revised: 01/17/2016 Document Reviewed: 01/17/2016 Elsevier Interactive Patient Education  2017 [employer]  

## 2017-06-09 LAB — URINE CULTURE
MICRO NUMBER:: 90115952
RESULT: NO GROWTH
SPECIMEN QUALITY:: ADEQUATE

## 2017-06-10 ENCOUNTER — Other Ambulatory Visit: Payer: Self-pay | Admitting: Family

## 2017-06-10 ENCOUNTER — Encounter: Payer: Self-pay | Admitting: Internal Medicine

## 2017-06-10 LAB — GC/CHLAMYDIA PROBE AMP
Chlamydia trachomatis, NAA: NEGATIVE
NEISSERIA GONORRHOEAE BY PCR: NEGATIVE

## 2017-06-10 MED ORDER — ALLOPURINOL 100 MG PO TABS: 100.0000 mg | ORAL_TABLET | Freq: Every day | ORAL | 1 refills | Status: DC

## 2017-06-16 ENCOUNTER — Telehealth: Payer: Self-pay | Admitting: Internal Medicine

## 2017-06-16 NOTE — Telephone Encounter (Signed)
Spoke with patient and told him I would leave a Plenvu sample up front to be picked up.  Patient agreed 

## 2017-06-19 ENCOUNTER — Ambulatory Visit: Payer: BLUE CROSS/BLUE SHIELD | Admitting: Gastroenterology

## 2017-06-22 ENCOUNTER — Other Ambulatory Visit: Payer: Self-pay

## 2017-06-22 ENCOUNTER — Encounter: Payer: Self-pay | Admitting: Internal Medicine

## 2017-06-22 ENCOUNTER — Ambulatory Visit (AMBULATORY_SURGERY_CENTER): Payer: BLUE CROSS/BLUE SHIELD | Admitting: Internal Medicine

## 2017-06-22 VITALS — BP 111/68 | HR 68 | Temp 97.7°F | Resp 11 | Ht 72.0 in | Wt 316.0 lb

## 2017-06-22 DIAGNOSIS — Z1211 Encounter for screening for malignant neoplasm of colon: Secondary | ICD-10-CM | POA: Diagnosis not present

## 2017-06-22 DIAGNOSIS — K921 Melena: Secondary | ICD-10-CM

## 2017-06-22 DIAGNOSIS — R1013 Epigastric pain: Secondary | ICD-10-CM

## 2017-06-22 DIAGNOSIS — R140 Abdominal distension (gaseous): Secondary | ICD-10-CM

## 2017-06-22 DIAGNOSIS — K2100 Gastro-esophageal reflux disease with esophagitis, without bleeding: Secondary | ICD-10-CM

## 2017-06-22 DIAGNOSIS — K625 Hemorrhage of anus and rectum: Secondary | ICD-10-CM

## 2017-06-22 MED ORDER — SODIUM CHLORIDE 0.9 % IV SOLN: 500.0000 mL | Freq: Once | INTRAVENOUS | Status: AC

## 2017-06-22 MED ORDER — SODIUM CHLORIDE 0.9 % IV SOLN
500.0000 mL | Freq: Once | INTRAVENOUS | Status: AC
Start: 2017-06-22 — End: ?

## 2017-06-22 NOTE — Op Note (Signed)
[facility] Patient Name: Nicholas [name] Procedure Date: 06/22/2017 11:31 AM MRN: [id number] Endoscopist: Wilhemina Bonito. Marina Goodell , MD Age: 37 Referring MD:  Date of Birth: [birth year] Gender: Male Account #: [id number] Procedure:                Colonoscopy Indications:              Rectal bleeding. Asymptomatic after treatment for                            anal fissure Medicines:                Monitored Anesthesia Care Procedure:                Pre-Anesthesia Assessment:                           - Prior to the procedure, a History and Physical                            was performed, and patient medications and                            allergies were reviewed. The patient's tolerance of                            previous anesthesia was also reviewed. The risks                            and benefits of the procedure and the sedation                            options and risks were discussed with the patient.                            All questions were answered, and informed consent                            was obtained. Prior Anticoagulants: The patient has                            taken no previous anticoagulant or antiplatelet                            agents. ASA Grade Assessment: II - A patient with                            mild systemic disease. After reviewing the risks                            and benefits, the patient was deemed in                            satisfactory condition to undergo the procedure.  After obtaining informed consent, the colonoscope                            was passed under direct vision. Throughout the                            procedure, the patient's blood pressure, pulse, and                            oxygen saturations were monitored continuously. The                            Colonoscope was introduced through the anus and                            advanced to the the cecum, identified by                       appendiceal orifice and ileocecal valve. The                            ileocecal valve, appendiceal orifice, and rectum                            were photographed. The quality of the bowel                            preparation was excellent. The colonoscopy was                            performed without difficulty. The patient tolerated                            the procedure well. The bowel preparation used was                            SUPREP. Scope In: 11:38:26 AM Scope Out: 11:51:26 AM Scope Withdrawal Time: 0 hours 9 minutes 37 seconds  Total Procedure Duration: 0 hours 13 minutes 0 seconds  Findings:                 The entire examined colon appeared normal on direct                            and retroflexion views. Complications:            No immediate complications. Estimated blood loss:                            None. Estimated Blood Loss:     Estimated blood loss: none. Impression:               - The entire examined colon is normal on direct and                            retroflexion views.                           -  No specimens collected. Recommendation:           - Repeat colonoscopy in 10 years for screening                            purposes.                           - Patient has a contact number available for                            emergencies. The signs and symptoms of potential                            delayed complications were discussed with the                            patient. Return to normal activities tomorrow.                            Written discharge instructions were provided to the                            patient.                           - Resume previous diet.                           - Continue present medications.                           - EGD today. Please see report Wilhemina Bonito. Marina Goodell, MD 06/22/2017 11:58:43 AM This report has been signed electronically.

## 2017-06-22 NOTE — Op Note (Signed)
[facility] Patient Name: Nicholas [name] Procedure Date: 06/22/2017 11:30 AM MRN: [id number] Endoscopist: Wilhemina Bonito. Marina Goodell , MD Age: 37 Referring MD:  Date of Birth: [birth year] Gender: Male Account #: [id number] Procedure:                Upper GI endoscopy Indications:              Abdominal pain, Abdominal distention, Abdominal                            bloating Medicines:                Monitored Anesthesia Care Procedure:                Pre-Anesthesia Assessment:                           - Prior to the procedure, a History and Physical                            was performed, and patient medications and                            allergies were reviewed. The patient's tolerance of                            previous anesthesia was also reviewed. The risks                            and benefits of the procedure and the sedation                            options and risks were discussed with the patient.                            All questions were answered, and informed consent                            was obtained. Prior Anticoagulants: The patient has                            taken no previous anticoagulant or antiplatelet                            agents. ASA Grade Assessment: II - A patient with                            mild systemic disease. After reviewing the risks                            and benefits, the patient was deemed in                            satisfactory condition to undergo the procedure.  After obtaining informed consent, the endoscope was                            passed under direct vision. Throughout the                            procedure, the patient's blood pressure, pulse, and                            oxygen saturations were monitored continuously. The                            Model GIF-HQ190 [id number]) scope was introduced                            through the mouth, and advanced to the second  part                            of duodenum. The upper GI endoscopy was                            accomplished without difficulty. The patient                            tolerated the procedure well. Scope In: Scope Out: Findings:                 The esophagus was normal.                           The stomach was normal, status post sleeve                            gastrectomy. Somewhat large sleeve.                           The examined duodenum was normal.                           The cardia and gastric fundus were normal on                            retroflexion. Complications:            No immediate complications. Estimated Blood Loss:     Estimated blood loss: none. Impression:               - Normal esophagus.                           - Normal stomach, status post sleeve gastrectomy.                           - Normal examined duodenum.                           - No specimens collected. Recommendation:           -  Patient has a contact number available for                            emergencies. The signs and symptoms of potential                            delayed complications were discussed with the                            patient. Return to normal activities tomorrow.                            Written discharge instructions were provided to the                            patient.                           - Resume previous diet.                           - Continue present medications.                           - Please provide patient with anti-gas and                            flatulence dietary sheet as well as brochure on                            intestinal gas                           - Try probiotic Align one twice daily for one                            month. He obtain this over the counter. This may                            help with your gas, as well as dietary changes                           - Returned care of your primary provider. GI                             follow-up as needed Wilhemina BonitoJohn N. Marina GoodellPerry, MD 06/22/2017 12:03:52 PM This report has been signed electronically.

## 2017-06-22 NOTE — Progress Notes (Signed)
A and O x3. Report to RN. Tolerated MAC anesthesia well.Teeth unchanged after procedure.

## 2017-06-22 NOTE — Patient Instructions (Signed)
YOU HAD AN ENDOSCOPIC PROCEDURE TODAY AT THE [facility]:   Refer to the procedure report that was given to you for any specific questions about what was found during the examination.  If the procedure report does not answer your questions, please call your gastroenterologist to clarify.  If you requested that your care partner not be given the details of your procedure findings, then the procedure report has been included in a sealed envelope for you to review at your convenience later.  YOU SHOULD EXPECT: Some feelings of bloating in the abdomen. Passage of more gas than usual.  Walking can help get rid of the air that was put into your GI tract during the procedure and reduce the bloating. If you had a lower endoscopy (such as a colonoscopy or flexible sigmoidoscopy) you may notice spotting of blood in your stool or on the toilet paper. If you underwent a bowel prep for your procedure, you may not have a normal bowel movement for a few days.  Please Note:  You might notice some irritation and congestion in your nose or some drainage.  This is from the oxygen used during your procedure.  There is no need for concern and it should clear up in a day or so.  SYMPTOMS TO REPORT IMMEDIATELY:   Following lower endoscopy (colonoscopy or flexible sigmoidoscopy):  Excessive amounts of blood in the stool  Significant tenderness or worsening of abdominal pains  Swelling of the abdomen that is new, acute  Fever of 100F or higher   Following upper endoscopy (EGD)  Vomiting of blood or coffee ground material  New chest pain or pain under the shoulder blades  Painful or persistently difficult swallowing  New shortness of breath  Fever of 100F or higher  Black, tarry-looking stools  For urgent or emergent issues, a gastroenterologist can be reached at any hour by calling [phone].   DIET:  We do recommend a small meal at first, but then you may proceed to your regular diet.  Drink  plenty of fluids but you should avoid alcoholic beverages for 24 hours.  ACTIVITY:  You should plan to take it easy for the rest of today and you should NOT DRIVE or use heavy machinery until tomorrow (because of the sedation medicines used during the test).    FOLLOW UP: Our staff will call the number listed on your records the next business day following your procedure to check on you and address any questions or concerns that you may have regarding the information given to you following your procedure. If we do not reach you, we will leave a message.  However, if you are feeling well and you are not experiencing any problems, there is no need to return our call.  We will assume that you have returned to your regular daily activities without incident.  If any biopsies were taken you will be contacted by phone or by letter within the next 1-3 weeks.  Please call us at [phone] if you have not heard about the biopsies in 3 weeks.    SIGNATURES/CONFIDENTIALITY: You and/or your care partner have signed paperwork which will be entered into your electronic medical record.  These signatures attest to the fact that that the information above on your After Visit Summary has been reviewed and is understood.  Full responsibility of the confidentiality of this discharge information lies with you and/or your care-partner. 

## 2017-06-23 ENCOUNTER — Telehealth: Payer: Self-pay | Admitting: *Deleted

## 2017-06-23 NOTE — Telephone Encounter (Signed)
  Follow up Call-  Call back number 06/22/2017  Post procedure Call Back phone  # [phone]  Permission to leave phone message Yes  Some recent data might be hidden     Patient questions:  Do you have a fever, pain , or abdominal swelling? No. Pain Score  0 *  Have you tolerated food without any problems? Yes.    Have you been able to return to your normal activities? Yes.    Do you have any questions about your discharge instructions: Diet   No. Medications  No. Follow up visit  No.  Do you have questions or concerns about your Care? No.  Actions: * If pain score is 4 or above: No action needed, pain <4.

## 2017-07-17 ENCOUNTER — Other Ambulatory Visit (INDEPENDENT_AMBULATORY_CARE_PROVIDER_SITE_OTHER): Payer: BLUE CROSS/BLUE SHIELD

## 2017-07-17 ENCOUNTER — Ambulatory Visit (INDEPENDENT_AMBULATORY_CARE_PROVIDER_SITE_OTHER): Payer: BLUE CROSS/BLUE SHIELD | Admitting: Family

## 2017-07-17 ENCOUNTER — Ambulatory Visit (INDEPENDENT_AMBULATORY_CARE_PROVIDER_SITE_OTHER)
Admission: RE | Admit: 2017-07-17 | Discharge: 2017-07-17 | Disposition: A | Discharge status: Home / Self Care | Payer: BLUE CROSS/BLUE SHIELD | Source: Ambulatory Visit | Attending: Family | Admitting: Family

## 2017-07-17 ENCOUNTER — Other Ambulatory Visit: Payer: Self-pay | Admitting: Family

## 2017-07-17 ENCOUNTER — Encounter: Payer: Self-pay | Admitting: Family

## 2017-07-17 VITALS — BP 138/88 | HR 69 | Temp 97.8°F | Ht 72.0 in | Wt 325.0 lb

## 2017-07-17 DIAGNOSIS — R300 Dysuria: Secondary | ICD-10-CM

## 2017-07-17 DIAGNOSIS — F411 Generalized anxiety disorder: Secondary | ICD-10-CM

## 2017-07-17 DIAGNOSIS — M25551 Pain in right hip: Secondary | ICD-10-CM | POA: Diagnosis not present

## 2017-07-17 DIAGNOSIS — M109 Gout, unspecified: Secondary | ICD-10-CM

## 2017-07-17 LAB — URINALYSIS
Bilirubin Urine: NEGATIVE
HGB URINE DIPSTICK: NEGATIVE
KETONES UR: NEGATIVE
LEUKOCYTES UA: NEGATIVE
Nitrite: NEGATIVE
SPECIFIC GRAVITY, URINE: 1.015 (ref 1.000–1.030)
Total Protein, Urine: NEGATIVE
UROBILINOGEN UA: 0.2 (ref 0.0–1.0)
Urine Glucose: NEGATIVE
pH: 7 (ref 5.0–8.0)

## 2017-07-17 LAB — URIC ACID: Uric Acid, Serum: 6.8 mg/dL (ref 4.0–7.8)

## 2017-07-17 MED ORDER — ALLOPURINOL 100 MG PO TABS: 100.0000 mg | ORAL_TABLET | Freq: Every day | ORAL | 5 refills | Status: DC

## 2017-07-17 MED ORDER — LORAZEPAM 1 MG PO TABS: 1.0000 mg | ORAL_TABLET | Freq: Three times a day (TID) | ORAL | 0 refills | Status: DC | PRN

## 2017-07-17 NOTE — Progress Notes (Signed)
Nicholas [name] is a 37 y.o. male with the following history as recorded in EpicCare:  Patient Active Problem List   Diagnosis Date Noted  . Routine adult health maintenance 09/22/2016  . Obesity 09/22/2016  . GERD (gastroesophageal reflux disease) 08/15/2015  . Generalized anxiety disorder 07/16/2015  . Sleep disturbance 07/16/2015  . Seasonal allergies 07/16/2015    Current Outpatient Medications  Medication Sig Dispense Refill  . allopurinol (ZYLOPRIM) 100 MG tablet Take 1 tablet (100 mg total) by mouth daily. 30 tablet 1  . LORazepam (ATIVAN) 1 MG tablet Take 1 tablet (1 mg total) by mouth every 8 (eight) hours as needed for anxiety. 30 tablet 0  . meloxicam (MOBIC) 15 MG tablet Take 1 tablet (15 mg total) by mouth daily. 90 tablet 3  . NITRO-BID 2 % ointment   0  . pantoprazole (PROTONIX) 40 MG tablet Take 1 tablet (40 mg total) by mouth 2 (two) times daily. 180 tablet 1   Current Facility-Administered Medications  Medication Dose Route Frequency Provider Last Rate Last Dose  . 0.9 %  sodium chloride infusion  500 mL Intravenous Once Hilarie FredricksonPerry, John N, MD      . 0.9 %  sodium chloride infusion  500 mL Intravenous Once Hilarie FredricksonPerry, John N, MD        Allergies: Patient has no known allergies.  Past Medical History:  Diagnosis Date  . Anxiety   . Arthritis   . Depression   . Gout   . Hypertension     Past Surgical History:  Procedure Laterality Date  . APPENDECTOMY    . CHOLECYSTECTOMY    . FOOT SURGERY Left   . GASTRIC BYPASS      Family History  Problem Relation Age of Onset  . Hepatitis C Mother   . Bone cancer Father   . Pancreatic cancer Paternal Uncle     Social History   Tobacco Use  . Smoking status: Never Smoker  . Smokeless tobacco: Never Used  Substance Use Topics  . Alcohol use: Yes    Comment: occasionally    Subjective:  Patient presents to follow-up on multiple concerns:  1) At last office visit, was seen with concerns for urinary frequency; was  treated for possible prostate infection with Cipro x 2 weeks; notes that the frequency has resolved but still having pain after intercourse and occasional discomfort with urination especially after holding urine for extended period of time; no concerns for STD exposure; no blood in urine; no fever;  2) Was started on allopurinol at last OV for gout; does feel that has noticed some improvement in the movement/ flexibility of his hands since starting the medication; 3) Has had upper GI and colonoscopy since last OV; both tests were normal; does feel that doing well on Protonix- only taking once per day; 4) Requesting refill on Ativan for anxiety; 5) Mentions concerns for chronic hip pain; agreeable to updating X-ray; does see foot specialist regularly who has patient on Mobic daily for pain;   Objective:  Vitals:   07/17/17 1325  BP: 138/88  Pulse: 69  Temp: 97.8 F (36.6 C)  TempSrc: Oral  SpO2: 100%  Weight: (!) 325 lb 0.6 oz (147.4 kg)  Height: 6' (1.829 m)    General: Well developed, well nourished, in no acute distress  Skin : Warm and dry.  Head: Normocephalic and atraumatic  Lungs: Respirations unlabored; clear to auscultation bilaterally without wheeze, rales, rhonchi  CVS exam: normal rate and regular rhythm.  Neurologic: Alert and oriented; speech intact; face symmetrical; moves all extremities well; CNII-XII intact without focal deficit   Assessment:  1. Dysuria   2. Gout, unspecified cause, unspecified chronicity, unspecified site   3. Generalized anxiety disorder   4. Pain of right hip joint     Plan:  1. Repeat U/A; symptoms still present after 14 days of Cipro; will refer to urology for further evaluation; 2. Update uric acid level today- will adjust Allopurinol accordingly; 3. Refill given on Ativan #30/ 0 Rf; 4. Update Right hip x-ray; continue Mobic daily;   No Follow-up on file.  Orders Placed This Encounter  Procedures  . DG HIP UNILAT WITH PELVIS 2-3 VIEWS  RIGHT    Standing Status:   Future    Number of Occurrences:   1    Standing Expiration Date:   09/17/2018    Order Specific Question:   Reason for Exam (SYMPTOM  OR DIAGNOSIS REQUIRED)    Answer:   chronic hip pain    Order Specific Question:   Preferred imaging location?    Answer:   [facility]    Order Specific Question:   Radiology Contrast Protocol - do NOT remove file path    Answer:   [email or web address]  . Uric acid    Standing Status:   Future    Number of Occurrences:   1    Standing Expiration Date:   07/17/2018  . Urinalysis    Standing Status:   Future    Number of Occurrences:   1    Standing Expiration Date:   07/17/2018  . Ambulatory referral to Urology    Referral Priority:   Routine    Referral Type:   Consultation    Referral Reason:   Specialty Services Required    Requested Specialty:   Urology    Number of Visits Requested:   1    Requested Prescriptions   Signed Prescriptions Disp Refills  . LORazepam (ATIVAN) 1 MG tablet 30 tablet 0    Sig: Take 1 tablet (1 mg total) by mouth every 8 (eight) hours as needed for anxiety.

## 2017-07-21 ENCOUNTER — Encounter: Payer: Self-pay | Admitting: Family Medicine

## 2017-07-21 ENCOUNTER — Ambulatory Visit (INDEPENDENT_AMBULATORY_CARE_PROVIDER_SITE_OTHER): Payer: BLUE CROSS/BLUE SHIELD | Admitting: Family Medicine

## 2017-07-21 VITALS — BP 128/88 | HR 79 | Temp 98.5°F | Ht 72.0 in | Wt 319.1 lb

## 2017-07-21 DIAGNOSIS — M25551 Pain in right hip: Secondary | ICD-10-CM

## 2017-07-21 MED ORDER — DICLOFENAC SODIUM 2 % TD SOLN: 1.0000 "application " | Freq: Two times a day (BID) | TRANSDERMAL | 3 refills | Status: DC

## 2017-07-21 NOTE — Patient Instructions (Signed)
Please try the exercises  Please try the rub on medicine  Please follow up with me in 3 weeks if your pain hasn't improved.

## 2017-07-21 NOTE — Progress Notes (Signed)
Nicholas [name] - 37 y.o. male MRN [id number]  Date of birth: 1981/04/04  SUBJECTIVE:  Including CC & ROS.  Chief Complaint  Patient presents with  . Hip Pain    Right hip pain (x 2  months)    Nicholas [name] is a 37 y.o. male that is presenting with b/l lateral hip pain. The pain is worse with sitting for prolonged periods of time. No radicular symptoms. Pain has been ongoing for a few weeks. Seems to be staying the same. It is intermittent. Has not had improvement with home therapies.  Independent review of the right hip pain on 3/8 shows normal joint space.    Review of Systems  Constitutional: Negative for fever.  HENT: Negative for congestion.   Respiratory: Negative for cough.   Cardiovascular: Negative for chest pain.  Gastrointestinal: Negative for abdominal pain.  Musculoskeletal: Negative for gait problem.  Skin: Negative for color change.  Neurological: Negative for numbness.  Hematological: Negative for adenopathy.  Psychiatric/Behavioral: Negative for agitation.    HISTORY: Past Medical, Surgical, Social, and Family History Reviewed & Updated per EMR.   Pertinent Historical Findings include:  Past Medical History:  Diagnosis Date  . Anxiety   . Arthritis   . Depression   . Gout   . Hypertension     Past Surgical History:  Procedure Laterality Date  . APPENDECTOMY    . CHOLECYSTECTOMY    . FOOT SURGERY Left   . GASTRIC BYPASS      No Known Allergies  Family History  Problem Relation Age of Onset  . Hepatitis C Mother   . Bone cancer Father   . Pancreatic cancer Paternal Uncle      Social History   Socioeconomic History  . Marital status: Married    Spouse name: Not on file  . Number of children: 3  . Years of education: 612  . Highest education level: Not on file  Social Needs  . Financial resource strain: Not on file  . Food insecurity - worry: Not on file  . Food insecurity - inability: Not on file  . Transportation needs -  medical: Not on file  . Transportation needs - non-medical: Not on file  Occupational History  . Occupation: Truck Hospital doctorDriver  Tobacco Use  . Smoking status: Never Smoker  . Smokeless tobacco: Never Used  Substance and Sexual Activity  . Alcohol use: Yes    Comment: occasionally  . Drug use: No  . Sexual activity: Yes  Other Topics Concern  . Not on file  Social History Narrative   Fun: Basketball, weight lifting, watch movies      PHYSICAL EXAM:  VS: BP 128/88 (BP Location: Left Arm, Patient Position: Sitting, Cuff Size: Large)   Pulse 79   Temp 98.5 F (36.9 C) (Oral)   Ht 6' (1.829 m)   Wt (!) 319 lb 1.3 oz (144.7 kg)   SpO2 99%   BMI 43.28 kg/m  Physical Exam Gen: NAD, alert, cooperative with exam, well-appearing ENT: normal lips, normal nasal mucosa,  Eye: normal EOM, normal conjunctiva and lids CV:  no edema, +2 pedal pulses   Resp: no accessory muscle use, non-labored,  Skin: no rashes, no areas of induration  Neuro: normal tone, normal sensation to touch Psych:  normal insight, alert and oriented MSK:  Right hip:  No swelling  No TTP of the piriformis Mild TTP of the GT  Normal IR and ER  Normal strength to resistance with hip  flexion  Negative SLR b/l  Weakness to resistance with hip flexion  Normal gait  Neurovascularly intact.       ASSESSMENT & PLAN:   Right hip pain Likely related to be glute med syndrome. Has weakness and occurs b/l. Possible for radiculopathy. 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

## 2017-07-22 DIAGNOSIS — M25551 Pain in right hip: Secondary | ICD-10-CM | POA: Insufficient documentation

## 2017-07-22 NOTE — Assessment & Plan Note (Signed)
Likely related to be glute med syndrome. Has weakness and occurs b/l. Possible for radiculopathy. 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

## 2017-07-28 ENCOUNTER — Encounter: Payer: Self-pay | Admitting: Nurse Practitioner

## 2017-07-28 ENCOUNTER — Ambulatory Visit (INDEPENDENT_AMBULATORY_CARE_PROVIDER_SITE_OTHER): Payer: BLUE CROSS/BLUE SHIELD | Admitting: Nurse Practitioner

## 2017-07-28 VITALS — BP 122/78 | HR 74 | Temp 98.4°F | Resp 16 | Ht 72.0 in | Wt 320.0 lb

## 2017-07-28 DIAGNOSIS — F411 Generalized anxiety disorder: Secondary | ICD-10-CM | POA: Diagnosis not present

## 2017-07-28 NOTE — Progress Notes (Signed)
Name: Nicholas [name]   MRN: [id number]    DOB: [birth year]   Date:07/28/2017       Progress Note  Subjective  Chief Complaint  Chief Complaint  Patient presents with  . Medication Management    wanted to make sure he was able to get refill of ativan    HPI Mr Knoll is here today for a medication refill- ativan. Over the past several months he has presented with GERD, hemorrhoids, joint pain and urinary symptoms. He has started following with gastroenterology and sports medicine and is currently awaiting referral to urology for his urinary symptoms. He says he feels well today.  Anxiety- maintained on ativan 1 mg once daily at bedtime  He has been maintained on ativan for about 4-5 years for his anxiety He says the ativan does make him drowsy but he only takes before bedtime to help sleep through the night and it makes him feel calmer the next day as well. He was actually on TID dosing at one time but he works as a Naval architect and is only allowed to take the ativan at bedtime now. He says he was easily able to reduce the dosage to once daily He reports some feels of anxiety daily when he is around new people or in social situations He denies feeling restlessness, depression, paranoia, thoughts of hurting himself or others. He has been on buspar, wellburtin in the past which both made him drowsy and he prefers the ativan  Patient Active Problem List   Diagnosis Date Noted  . Right hip pain 07/22/2017  . Routine adult health maintenance 09/22/2016  . Obesity 09/22/2016  . GERD (gastroesophageal reflux disease) 08/15/2015  . Generalized anxiety disorder 07/16/2015  . Sleep disturbance 07/16/2015  . Seasonal allergies 07/16/2015    Past Surgical History:  Procedure Laterality Date  . APPENDECTOMY    . CHOLECYSTECTOMY    . FOOT SURGERY Left   . GASTRIC BYPASS      Family History  Problem Relation Age of Onset  . Hepatitis C Mother   . Bone cancer Father   . Pancreatic  cancer Paternal Uncle     Social History   Socioeconomic History  . Marital status: Married    Spouse name: Not on file  . Number of children: 3  . Years of education: 80  . Highest education level: Not on file  Social Needs  . Financial resource strain: Not on file  . Food insecurity - worry: Not on file  . Food insecurity - inability: Not on file  . Transportation needs - medical: Not on file  . Transportation needs - non-medical: Not on file  Occupational History  . Occupation: Truck Hospital doctor  Tobacco Use  . Smoking status: Never Smoker  . Smokeless tobacco: Never Used  Substance and Sexual Activity  . Alcohol use: Yes    Comment: occasionally  . Drug use: No  . Sexual activity: Yes  Other Topics Concern  . Not on file  Social History Narrative   Fun: Basketball, weight lifting, watch movies      Current Outpatient Medications:  .  allopurinol (ZYLOPRIM) 100 MG tablet, Take 1 tablet (100 mg total) by mouth daily., Disp: 30 tablet, Rfl: 5 .  Diclofenac Sodium (PENNSAID) 2 % SOLN, Place 1 application onto the skin 2 (two) times daily., Disp: 1 Bottle, Rfl: 3 .  LORazepam (ATIVAN) 1 MG tablet, Take 1 tablet (1 mg total) by mouth every 8 (eight) hours as needed  for anxiety., Disp: 30 tablet, Rfl: 0 .  meloxicam (MOBIC) 15 MG tablet, Take 1 tablet (15 mg total) by mouth daily., Disp: 90 tablet, Rfl: 3 .  NITRO-BID 2 % ointment, , Disp: , Rfl: 0 .  pantoprazole (PROTONIX) 40 MG tablet, Take 1 tablet (40 mg total) by mouth 2 (two) times daily., Disp: 180 tablet, Rfl: 1  Current Facility-Administered Medications:  .  0.9 %  sodium chloride infusion, 500 mL, Intravenous, Once, Hilarie FredricksonPerry, John N, MD .  0.9 %  sodium chloride infusion, 500 mL, Intravenous, Once, Hilarie FredricksonPerry, John N, MD  No Known Allergies   ROS See HPI  Objective  Vitals:   07/28/17 1009  BP: 122/78  Pulse: 74  Resp: 16  Temp: 98.4 F (36.9 C)  TempSrc: Oral  SpO2: 97%  Weight: (!) 320 lb (145.2 kg)   Height: 6' (1.829 m)   Body mass index is 43.4 kg/m.  Physical Exam Vital signs reviewed. Constitutional: Patient appears well-developed and well-nourished. No distress.  HENT: Head: Normocephalic and atraumatic. Nose: Nose normal. Mouth/Throat: Oropharynx is clear and moist. No oropharyngeal exudate.  Eyes: Conjunctivae and EOM are normal. Pupils are equal, round, and reactive to light. No scleral icterus.  Neck: Normal range of motion. Neck supple. Cardiovascular: Normal rate, regular rhythm. Distal pulses intact. Pulmonary/Chest: Effort normal and breath sounds normal. No respiratory distress. Neurological: He is alert and oriented to person, place, and time.Coordination, balance, strength, speech and gait are normal.  Skin: Skin is warm and dry. No rash noted. No erythema.  Psychiatric: Patient has a normal mood and affect. Behavior is normal. Judgment and thought content normal.  PHQ2/9: Depression screen PHQ 2/9 09/22/2016  Decreased Interest 0  Down, Depressed, Hopeless 0  PHQ - 2 Score 0    Assessment & Plan RTC around May for CPE

## 2017-07-28 NOTE — Assessment & Plan Note (Signed)
We discussed the long term risks of benzodiazepine use including memory loss and addiction. He would like to continue ativan once daily at bedtime which is reasonable, but we will not increase his dosage We discussed trying safer alternative medications in the future if he feels he needs to increase ativan dosage or for worsening anxiety We also Discussed non pharm techniques to manage anxiety- See AVS for additional information provided to patient

## 2017-07-28 NOTE — Patient Instructions (Addendum)
Return after May 14 for your annual physical.  Let me know when you need your ativan refill. We will continue you at current dosage 1mg  daily at bedtime.  See you soon!   Mindfulness-Based Stress Reduction Mindfulness-based stress reduction (MBSR) is a program that helps people learn to practice mindfulness. Mindfulness is the practice of intentionally paying attention to the present moment. It can be learned and practiced through techniques such as education, breathing exercises, meditation, and yoga. MBSR includes several mindfulness techniques in one program. MBSR works best when you understand the treatment, are willing to try new things, and can commit to spending time practicing what you learn. MBSR training may include learning about:  How your emotions, thoughts, and reactions affect your body.  New ways to respond to things that cause negative thoughts to start (triggers).  How to notice your thoughts and let go of them.  Practicing awareness of everyday things that you normally do without thinking.  The techniques and goals of different types of meditation.  What are the benefits of MBSR? MBSR can have many benefits, which include helping you to:  Develop self-awareness. This refers to knowing and understanding yourself.  Learn skills and attitudes that help you to participate in your own health care.  Learn new ways to care for yourself.  Be more accepting about how things are, and let things go.  Be less judgmental and approach things with an open mind.  Be patient with yourself and trust yourself more.  MBSR has also been shown to:  Reduce negative emotions, such as depression and anxiety.  Improve memory and focus.  Change how you sense and approach pain.  Boost your body's ability to fight infections.  Help you connect better with other people.  Improve your sense of well-being.  Follow these instructions at home:  Find a local in-person or online  MBSR program.  Set aside some time regularly for mindfulness practice.  Find a mindfulness practice that works best for you. This may include one or more of the following: ? Meditation. Meditation involves focusing your mind on a certain thought or activity. ? Breathing awareness exercises. These help you to stay present by focusing on your breath. ? Body scan. For this practice, you lie down and pay attention to each part of your body from head to toe. You can identify tension and soreness and intentionally relax parts of your body. ? Yoga. Yoga involves stretching and breathing, and it can improve your ability to move and be flexible. It can also provide an experience of testing your body's limits, which can help you release stress. ? Mindful eating. This way of eating involves focusing on the taste, texture, color, and smell of each bite of food. Because this slows down eating and helps you feel full sooner, it can be an important part of a weight-loss plan.  Find a podcast or recording that provides guidance for breathing awareness, body scan, or meditation exercises. You can listen to these any time when you have a free moment to rest without distractions.  Follow your treatment plan as told by your health care provider. This may include taking regular medicines and making changes to your diet or lifestyle as recommended. How to practice mindfulness To do a basic awareness exercise:  Find a comfortable place to sit.  Pay attention to the present moment. Observe your thoughts, feelings, and surroundings just as they are.  Avoid placing judgment on yourself, your feelings, or your surroundings. Make  note of any judgment that comes up, and let it go.  Your mind may wander, and that is okay. Make note of when your thoughts drift, and return your attention to the present moment.  To do basic mindfulness meditation:  Find a comfortable place to sit. This may include a stable chair or a firm  floor cushion. ? Sit upright with your back straight. Let your arms fall next to your side with your hands resting on your legs. ? If sitting in a chair, rest your feet flat on the floor. ? If sitting on a cushion, cross your legs in front of you.  Keep your head in a neutral position with your chin dropped slightly. Relax your jaw and rest the tip of your tongue on the roof of your mouth. Drop your gaze to the floor. You can close your eyes if you like.  Breathe normally and pay attention to your breath. Feel the air moving in and out of your nose. Feel your belly expanding and relaxing with each breath.  Your mind may wander, and that is okay. Make note of when your thoughts drift, and return your attention to your breath.  Avoid placing judgment on yourself, your feelings, or your surroundings. Make note of any judgment or feelings that come up, let them go, and bring your attention back to your breath.  When you are ready, lift your gaze or open your eyes. Pay attention to how your body feels after the meditation.  Where to find more information: You can find more information about MBSR from:  Your health care provider.  Community-based meditation centers or programs.  Programs offered near you.  Summary  Mindfulness-based stress reduction (MBSR) is a program that teaches you how to intentionally pay attention to the present moment. It is used with other treatments to help you cope better with daily stress, emotions, and pain.  MBSR focuses on developing self-awareness, which allows you to respond to life stress without judgment or negative emotions.  MBSR programs may involve learning different mindfulness practices, such as breathing exercises, meditation, yoga, body scan, or mindful eating. Find a mindfulness practice that works best for you, and set aside time for it on a regular basis. This information is not intended to replace advice given to you by your health care  provider. Make sure you discuss any questions you have with your health care provider. Document Released: 09/04/2016 Document Revised: 09/04/2016 Document Reviewed: 09/04/2016 Elsevier Interactive Patient Education  [employer].

## 2017-07-29 ENCOUNTER — Other Ambulatory Visit: Payer: Self-pay | Admitting: Family

## 2017-07-29 DIAGNOSIS — K219 Gastro-esophageal reflux disease without esophagitis: Secondary | ICD-10-CM

## 2017-08-05 ENCOUNTER — Other Ambulatory Visit: Payer: Self-pay | Admitting: Nurse Practitioner

## 2017-08-05 DIAGNOSIS — F411 Generalized anxiety disorder: Secondary | ICD-10-CM

## 2017-08-05 NOTE — Telephone Encounter (Signed)
Last refill was 07/17/17 per database.

## 2017-08-05 NOTE — Telephone Encounter (Signed)
Copied from CRM [id number]. Topic: Quick Communication - Rx Refill/Question >> Aug 05, 2017 12:52 PM Alexander BergeronBarksdale, Nicholas B wrote: Medication: LORazepam (ATIVAN) 1 MG tablet [[id number]][[id number]]  Has the patient contacted their pharmacy? No. (Agent: If no, request that the patient contact the pharmacy for the refill.) [facility] (with phone [facility] Agent: Please be advised that RX refills may take up to 3 business days. We ask that you follow-up with your pharmacy.

## 2017-08-05 NOTE — Telephone Encounter (Signed)
LOV  07/28/17 [facility]

## 2017-08-07 ENCOUNTER — Telehealth: Payer: Self-pay

## 2017-08-07 NOTE — Telephone Encounter (Signed)
Copied from CRM [id number]. Topic: Quick Communication - Rx Refill/Question >> Aug 05, 2017 12:52 PM Alexander BergeronBarksdale, Harvey B wrote: Medication: LORazepam (ATIVAN) 1 MG tablet [[id number]][[id number]]  Has the patient contacted their pharmacy? No. (Agent: If no, request that the patient contact the pharmacy for the refill.) [facility] (with [facility] Agent: Please be advised that RX refills may take up to 3 business days. We ask that you follow-up with your pharmacy. >> Aug 07, 2017 11:38 AM Jolayne Hainesaylor, Brittany L wrote: Pt wants to know if this can be sent to pharmacy for it to be put on file so he can call the pharmacy to request it next time

## 2017-08-07 NOTE — Telephone Encounter (Signed)
Can we call to discuss this refill request with him? At his recent OV, he told me he was only taking 1 pill every evening at bedtime. We discussed not increasing his dosage above this. He is requesting this medication too early according to this conversation.

## 2017-08-07 NOTE — Telephone Encounter (Signed)
Tried calling pt with response below. No answer and VM was full.

## 2017-08-18 ENCOUNTER — Other Ambulatory Visit: Payer: Self-pay | Admitting: Nurse Practitioner

## 2017-08-18 DIAGNOSIS — F411 Generalized anxiety disorder: Secondary | ICD-10-CM

## 2017-08-18 NOTE — Telephone Encounter (Signed)
LOV: 07/28/17  Nicholas GrahamAshleigh [facility],NP  [facility]     [address]

## 2017-08-18 NOTE — Telephone Encounter (Signed)
Copied from CRM [id number]. Topic: Quick Communication - Rx Refill/Question >> Aug 18, 2017  3:27 PM Cipriano BunkerLambe, Annette S wrote:  Medication:LORazepam (ATIVAN) 1 MG tablet This was out on 4/8 and now he is out They had asked early to go it by today. [facility]  Has the patient contacted their pharmacy? Yes.    (Agent: If no, request that the patient contact the pharmacy for the refill.)  Preferred Pharmacy (with phone number or street name):   [id number] [facility] [id number] [facility] [facility] [address] [facility] [address] [place] [id number] Phone: [phone] Fax: [phone]   Agent: Please be advised that RX refills may take up to 3 business days. We ask that you follow-up with your pharmacy.

## 2017-08-19 MED ORDER — LORAZEPAM 1 MG PO TABS: 1.0000 mg | ORAL_TABLET | Freq: Three times a day (TID) | ORAL | 0 refills | Status: DC | PRN

## 2017-08-19 NOTE — Telephone Encounter (Signed)
Check [place] registry last filled 03/08/201.../;/lmb

## 2017-08-25 DIAGNOSIS — R300 Dysuria: Secondary | ICD-10-CM | POA: Diagnosis not present

## 2017-08-25 DIAGNOSIS — Z113 Encounter for screening for infections with a predominantly sexual mode of transmission: Secondary | ICD-10-CM | POA: Diagnosis not present

## 2017-08-25 DIAGNOSIS — N5312 Painful ejaculation: Secondary | ICD-10-CM | POA: Diagnosis not present

## 2017-08-25 DIAGNOSIS — Z118 Encounter for screening for other infectious and parasitic diseases: Secondary | ICD-10-CM | POA: Diagnosis not present

## 2017-09-11 ENCOUNTER — Other Ambulatory Visit: Payer: Self-pay | Admitting: Family

## 2017-09-11 DIAGNOSIS — F411 Generalized anxiety disorder: Secondary | ICD-10-CM

## 2017-09-18 ENCOUNTER — Telehealth: Payer: Self-pay | Admitting: Nurse Practitioner

## 2017-09-18 DIAGNOSIS — F411 Generalized anxiety disorder: Secondary | ICD-10-CM

## 2017-09-18 NOTE — Telephone Encounter (Signed)
Copied from CRM [id number]. Topic: Quick Communication - Rx Refill/Question >> Sep 18, 2017  9:32 AM Floria Raveling A wrote: Medication: LORazepam (ATIVAN) 1 MG tablet [[id number]]  Has the patient contacted their pharmacy?no  (Agent: If no, request that the patient contact the pharmacy for the refill.) Preferred Pharm[facility] (with phone number [facility] [address]  Agent: Please be advised that RX refills may take up to 3 business days. We ask that you follow-up with your pharmacy.

## 2017-09-18 NOTE — Telephone Encounter (Signed)
Request refill for lorazepam 1 mg tab LR  08/19/17 for #30 tabs  Provider Alphonse Guild, NP LOV 07/28/17 NOV 09/25/17  [id number] [address] [place]

## 2017-09-19 MED ORDER — LORAZEPAM 1 MG PO TABS: 1.0000 mg | ORAL_TABLET | Freq: Three times a day (TID) | ORAL | 1 refills | Status: DC | PRN

## 2017-09-19 NOTE — Telephone Encounter (Signed)
Refill sent.

## 2017-09-25 ENCOUNTER — Encounter: Payer: BLUE CROSS/BLUE SHIELD | Admitting: Nurse Practitioner

## 2017-11-03 ENCOUNTER — Encounter: Payer: BLUE CROSS/BLUE SHIELD | Admitting: Nurse Practitioner

## 2017-11-03 DIAGNOSIS — Z0289 Encounter for other administrative examinations: Secondary | ICD-10-CM

## 2017-11-05 ENCOUNTER — Other Ambulatory Visit: Payer: Self-pay | Admitting: Nurse Practitioner

## 2017-11-05 DIAGNOSIS — F411 Generalized anxiety disorder: Secondary | ICD-10-CM

## 2017-11-06 NOTE — Telephone Encounter (Signed)
LOV w/ you: 07/1917 No existing appts Ok to Rf?

## 2017-11-14 ENCOUNTER — Other Ambulatory Visit: Payer: Self-pay | Admitting: Podiatry

## 2017-12-07 ENCOUNTER — Other Ambulatory Visit: Payer: Self-pay | Admitting: Nurse Practitioner

## 2017-12-07 DIAGNOSIS — F411 Generalized anxiety disorder: Secondary | ICD-10-CM

## 2017-12-09 NOTE — Telephone Encounter (Signed)
Last refill was 11/06/17 per database.

## 2018-01-05 ENCOUNTER — Other Ambulatory Visit: Payer: Self-pay | Admitting: Nurse Practitioner

## 2018-01-05 DIAGNOSIS — F411 Generalized anxiety disorder: Secondary | ICD-10-CM

## 2018-01-05 NOTE — Telephone Encounter (Signed)
[place] Controlled Database Checked Last filled: 12/09/17 # 30 LOV w/you: 07/28/17 Next appt w/you: None

## 2018-01-21 ENCOUNTER — Encounter: Payer: Self-pay | Admitting: Nurse Practitioner

## 2018-01-28 ENCOUNTER — Other Ambulatory Visit (INDEPENDENT_AMBULATORY_CARE_PROVIDER_SITE_OTHER): Payer: BLUE CROSS/BLUE SHIELD

## 2018-01-28 ENCOUNTER — Ambulatory Visit (INDEPENDENT_AMBULATORY_CARE_PROVIDER_SITE_OTHER): Payer: BLUE CROSS/BLUE SHIELD | Admitting: Nurse Practitioner

## 2018-01-28 ENCOUNTER — Encounter: Payer: Self-pay | Admitting: Nurse Practitioner

## 2018-01-28 VITALS — BP 118/86 | HR 78 | Ht 72.0 in | Wt 327.0 lb

## 2018-01-28 DIAGNOSIS — Z13220 Encounter for screening for lipoid disorders: Secondary | ICD-10-CM | POA: Diagnosis not present

## 2018-01-28 DIAGNOSIS — M79662 Pain in left lower leg: Secondary | ICD-10-CM | POA: Diagnosis not present

## 2018-01-28 DIAGNOSIS — K2100 Gastro-esophageal reflux disease with esophagitis, without bleeding: Secondary | ICD-10-CM

## 2018-01-28 DIAGNOSIS — F411 Generalized anxiety disorder: Secondary | ICD-10-CM

## 2018-01-28 DIAGNOSIS — Z0000 Encounter for general adult medical examination without abnormal findings: Secondary | ICD-10-CM

## 2018-01-28 DIAGNOSIS — M109 Gout, unspecified: Secondary | ICD-10-CM | POA: Diagnosis not present

## 2018-01-28 DIAGNOSIS — Z0001 Encounter for general adult medical examination with abnormal findings: Secondary | ICD-10-CM | POA: Diagnosis not present

## 2018-01-28 LAB — LIPID PANEL
CHOL/HDL RATIO: 4
Cholesterol: 190 mg/dL (ref 0–200)
HDL: 47.9 mg/dL (ref >39.00)
LDL CALC: 115 mg/dL — AB (ref 0–99)
NONHDL: 142.41
Triglycerides: 135 mg/dL (ref 0.0–149.0)
VLDL: 27 mg/dL (ref 0.0–40.0)

## 2018-01-28 LAB — CBC
HEMATOCRIT: 41.8 % (ref 39.0–52.0)
Hemoglobin: 14.1 g/dL (ref 13.0–17.0)
MCHC: 33.6 g/dL (ref 30.0–36.0)
MCV: 79.4 fl (ref 78.0–100.0)
Platelets: 216 10*3/uL (ref 150.0–400.0)
RBC: 5.26 Mil/uL (ref 4.22–5.81)
RDW: 13.8 % (ref 11.5–15.5)
WBC: 5.5 10*3/uL (ref 4.0–10.5)

## 2018-01-28 LAB — COMPREHENSIVE METABOLIC PANEL
ALBUMIN: 4.3 g/dL (ref 3.5–5.2)
ALT: 21 U/L (ref 0–53)
AST: 26 U/L (ref 0–37)
Alkaline Phosphatase: 62 U/L (ref 39–117)
BUN: 14 mg/dL (ref 6–23)
CHLORIDE: 103 meq/L (ref 96–112)
CO2: 29 meq/L (ref 19–32)
CREATININE: 1.47 mg/dL (ref 0.40–1.50)
Calcium: 9.5 mg/dL (ref 8.4–10.5)
GFR: 68.98 mL/min (ref >60.00)
GLUCOSE: 90 mg/dL (ref 70–99)
POTASSIUM: 4.1 meq/L (ref 3.5–5.1)
SODIUM: 140 meq/L (ref 135–145)
Total Bilirubin: 0.7 mg/dL (ref 0.2–1.2)
Total Protein: 7.5 g/dL (ref 6.0–8.3)

## 2018-01-28 LAB — URIC ACID: URIC ACID, SERUM: 8 mg/dL — AB (ref 4.0–7.8)

## 2018-01-28 MED ORDER — LORAZEPAM 1 MG PO TABS: ORAL_TABLET | ORAL | 1 refills | Status: DC

## 2018-01-28 MED ORDER — ALLOPURINOL 100 MG PO TABS: 100.0000 mg | ORAL_TABLET | Freq: Every day | ORAL | 5 refills | Status: DC

## 2018-01-28 NOTE — Assessment & Plan Note (Signed)
Stable Continue ativan at current dosage Controlled substance registry reviewed with no irregularities. F/U for new, worsening symptoms - LORazepam (ATIVAN) 1 MG tablet; TAKE 1 TABLET BY MOUTH AT BEDTIME. FOR ANXIETY  Dispense: 30 tablet; Refill: 1

## 2018-01-28 NOTE — Patient Instructions (Addendum)
Please head downstairs for lab work. If any of your test results are critically abnormal, you will be contacted right away. Otherwise, I will contact you within a week about your test results and follow up recommendations  Please schedule a follow up appointment for further evaluation of your leg pain here with Dr Katrinka BlazingSmith or Dr Jordan LikesSchmitz, our sports medicine providers  I will plan to see you back in 1 year, or sooner if needed.   Health Maintenance, Male A healthy lifestyle and preventive care is important for your health and wellness. Ask your health care provider about what schedule of regular examinations is right for you. What should I know about weight and diet? Eat a Healthy Diet  Eat plenty of vegetables, fruits, whole grains, low-fat dairy products, and lean protein.  Do not eat a lot of foods high in solid fats, added sugars, or salt.  Maintain a Healthy Weight Regular exercise can help you achieve or maintain a healthy weight. You should:  Do at least 150 minutes of exercise each week. The exercise should increase your heart rate and make you sweat (moderate-intensity exercise).  Do strength-training exercises at least twice a week.  Watch Your Levels of Cholesterol and Blood Lipids  Have your blood tested for lipids and cholesterol every 5 years starting at 38 years of age. If you are at high risk for heart disease, you should start having your blood tested when you are 38 years old. You may need to have your cholesterol levels checked more often if: ? Your lipid or cholesterol levels are high. ? You are older than 38 years of age. ? You are at high risk for heart disease.  What should I know about cancer screening? Many types of cancers can be detected early and may often be prevented. Lung Cancer  You should be screened every year for lung cancer if: ? You are a current smoker who has smoked for at least 30 years. ? You are a former smoker who has quit within the past  15 years.  Talk to your health care provider about your screening options, when you should start screening, and how often you should be screened.  Colorectal Cancer  Routine colorectal cancer screening usually begins at 38 years of age and should be repeated every 5-10 years until you are 38 years old. You may need to be screened more often if early forms of precancerous polyps or small growths are found. Your health care provider may recommend screening at an earlier age if you have risk factors for colon cancer.  Your health care provider may recommend using home test kits to check for hidden blood in the stool.  A small camera at the end of a tube can be used to examine your colon (sigmoidoscopy or colonoscopy). This checks for the earliest forms of colorectal cancer.  Prostate and Testicular Cancer  Depending on your age and overall health, your health care provider may do certain tests to screen for prostate and testicular cancer.  Talk to your health care provider about any symptoms or concerns you have about testicular or prostate cancer.  Skin Cancer  Check your skin from head to toe regularly.  Tell your health care provider about any new moles or changes in moles, especially if: ? There is a change in a mole's size, shape, or color. ? You have a mole that is larger than a pencil eraser.  Always use sunscreen. Apply sunscreen liberally and repeat throughout the day.  Protect yourself by wearing long sleeves, pants, a wide-brimmed hat, and sunglasses when outside.  What should I know about heart disease, diabetes, and high blood pressure?  If you are 48-21 years of age, have your blood pressure checked every 3-5 years. If you are 66 years of age or older, have your blood pressure checked every year. You should have your blood pressure measured twice-once when you are at a hospital or clinic, and once when you are not at a hospital or clinic. Record the average of the two  measurements. To check your blood pressure when you are not at a hospital or clinic, you can use: ? An automated blood pressure machine at a pharmacy. ? A home blood pressure monitor.  Talk to your health care provider about your target blood pressure.  If you are between 85-54 years old, ask your health care provider if you should take aspirin to prevent heart disease.  Have regular diabetes screenings by checking your fasting blood sugar level. ? If you are at a normal weight and have a low risk for diabetes, have this test once every three years after the age of 92. ? If you are overweight and have a high risk for diabetes, consider being tested at a younger age or more often.  A one-time screening for abdominal aortic aneurysm (AAA) by ultrasound is recommended for men aged 75-75 years who are current or former smokers. What should I know about preventing infection? Hepatitis B If you have a higher risk for hepatitis B, you should be screened for this virus. Talk with your health care provider to find out if you are at risk for hepatitis B infection. Hepatitis C Blood testing is recommended for:  Everyone born from [birth year] through 1966.  Anyone with known risk factors for hepatitis C.  Sexually Transmitted Diseases (STDs)  You should be screened each year for STDs including gonorrhea and chlamydia if: ? You are sexually active and are younger than 38 years of age. ? You are older than 38 years of age and your health care provider tells you that you are at risk for this type of infection. ? Your sexual activity has changed since you were last screened and you are at an increased risk for chlamydia or gonorrhea. Ask your health care provider if you are at risk.  Talk with your health care provider about whether you are at high risk of being infected with HIV. Your health care provider may recommend a prescription medicine to help prevent HIV infection.  What else can I do?  Schedule  regular health, dental, and eye exams.  Stay current with your vaccines (immunizations).  Do not use any tobacco products, such as cigarettes, chewing tobacco, and e-cigarettes. If you need help quitting, ask your health care provider.  Limit alcohol intake to no more than 2 drinks per day. One drink equals 12 ounces of beer, 5 ounces of wine, or 1 ounces of hard liquor.  Do not use street drugs.  Do not share needles.  Ask your health care provider for help if you need support or information about quitting drugs.  Tell your health care provider if you often feel depressed.  Tell your health care provider if you have ever been abused or do not feel safe at home. This information is not intended to replace advice given to you by your health care provider. Make sure you discuss any questions you have with your health care provider. Document Released: 10/25/2007 Document  Revised: 12/26/2015 Document Reviewed: 01/30/2015 Elsevier Interactive Patient Education  [employer].

## 2018-01-28 NOTE — Progress Notes (Signed)
Name: Nicholas [name]   MRN: [id number]    DOB: 12/03/1980   Date:01/28/2018       Progress Note  Subjective  Chief Complaint CPE   HPI  Patient presents for annual CPE. He is also requesting evaluation of left calf pain, first noticed the pain about 3 weeks ago after playing basketball. He says it feels tight in his calf,  has tried stretching, heat pad with no relief. Tightness Does seem to have improved some today after deep tissue massage yesterday. Denies swelling, erythema, he thinks his leg felt warm to touch last week. No history of DVT.  USPSTF grade A and B recommendations:  Diet, Exercise: working with personal trainer, lifting weights, doing cardio, trying to lose weight  Depression:  Maintained on ativan 1 qhs, taking daily without noted adverse effects and helps him get good sleep at night Depression screen [facility] 2/9 09/22/2016  Decreased Interest 0  Down, Depressed, Hopeless 0  PHQ - 2 Score 0   Hypertension: BP Readings from Last 3 Encounters:  01/28/18 118/86  07/28/17 122/78  07/21/17 128/88   Obesity: Wt Readings from Last 3 Encounters:  01/28/18 (!) 327 lb (148.3 kg)  07/28/17 (!) 320 lb (145.2 kg)  07/21/17 (!) 319 lb 1.3 oz (144.7 kg)   BMI Readings from Last 3 Encounters:  01/28/18 44.35 kg/m  07/28/17 43.40 kg/m  07/21/17 43.28 kg/m   Lipids: lipid panel today Lab Results  Component Value Date   CHOL 186 09/22/2016   Lab Results  Component Value Date   HDL 51.90 09/22/2016   Lab Results  Component Value Date   LDLCALC 117 (H) 09/22/2016   Lab Results  Component Value Date   TRIG 89.0 09/22/2016   Lab Results  Component Value Date   CHOLHDL 4 09/22/2016   No results found for: LDLDIRECT Glucose:  Glucose  Date Value Ref Range Status  08/17/2014 91 mg/dL Final    Comment:    40-98 NOTE: New Reference Range  07/18/14   11/14/2012 83 65 - 99 mg/dL Final  11/91/4782 83 65 - 99 mg/dL Final   Glucose, Bld  Date Value Ref  Range Status  04/07/2017 95 65 - 99 mg/dL Final  95/62/1308 84 70 - 99 mg/dL Final  65/78/4696 87 65 - 99 mg/dL Final   Alcohol: about 1 drink per week Tobacco use: no, never   STD testing and prevention (chl/gon/syphilis): no concerns HIV screening: declines  Colorectal cancer: No personal or family history of colon ca, no abdominal pain, no bowel changes, no rectal bleeding  ECG:  Not indicated  Vaccinations: declines flu vacc   Patient Active Problem List   Diagnosis Date Noted  . Right hip pain 07/22/2017  . Routine adult health maintenance 09/22/2016  . Obesity 09/22/2016  . GERD (gastroesophageal reflux disease) 08/15/2015  . Generalized anxiety disorder 07/16/2015  . Sleep disturbance 07/16/2015  . Seasonal allergies 07/16/2015    Past Surgical History:  Procedure Laterality Date  . APPENDECTOMY    . CHOLECYSTECTOMY    . FOOT SURGERY Left   . GASTRIC BYPASS      Family History  Problem Relation Age of Onset  . Hepatitis C Mother   . Bone cancer Father   . Pancreatic cancer Paternal Uncle     Social History   Socioeconomic History  . Marital status: Married    Spouse name: Not on file  . Number of children: 3  . Years of education: 79  .  Highest education level: Not on file  Occupational History  . Occupation: Truck Runner, broadcasting/film/videoDriver  Social Needs  . Financial resource strain: Not on file  . Food insecurity:    Worry: Not on file    Inability: Not on file  . Transportation needs:    Medical: Not on file    Non-medical: Not on file  Tobacco Use  . Smoking status: Never Smoker  . Smokeless tobacco: Never Used  Substance and Sexual Activity  . Alcohol use: Yes    Comment: occasionally  . Drug use: No  . Sexual activity: Yes  Lifestyle  . Physical activity:    Days per week: Not on file    Minutes per session: Not on file  . Stress: Not on file  Relationships  . Social connections:    Talks on phone: Not on file    Gets together: Not on file     Attends religious service: Not on file    Active member of club or organization: Not on file    Attends meetings of clubs or organizations: Not on file    Relationship status: Not on file  . Intimate partner violence:    Fear of current or ex partner: Not on file    Emotionally abused: Not on file    Physically abused: Not on file    Forced sexual activity: Not on file  Other Topics Concern  . Not on file  Social History Narrative   Fun: Basketball, weight lifting, watch movies      Current Outpatient Medications:  .  allopurinol (ZYLOPRIM) 100 MG tablet, Take 1 tablet (100 mg total) by mouth daily., Disp: 30 tablet, Rfl: 5 .  Diclofenac Sodium (PENNSAID) 2 % SOLN, Place 1 application onto the skin 2 (two) times daily., Disp: 1 Bottle, Rfl: 3 .  LORazepam (ATIVAN) 1 MG tablet, TAKE 1 TABLET BY MOUTH AT BEDTIME. FOR ANXIETY, Disp: 30 tablet, Rfl: 0 .  meloxicam (MOBIC) 15 MG tablet, TAKE 1 TABLET BY MOUTH EVERY DAY, Disp: 90 tablet, Rfl: 3 .  NITRO-BID 2 % ointment, , Disp: , Rfl: 0 .  pantoprazole (PROTONIX) 40 MG tablet, Take 1 tablet (40 mg total) by mouth 2 (two) times daily., Disp: 180 tablet, Rfl: 1  Current Facility-Administered Medications:  .  0.9 %  sodium chloride infusion, 500 mL, Intravenous, Once, Hilarie FredricksonPerry, John N, MD .  0.9 %  sodium chloride infusion, 500 mL, Intravenous, Once, Hilarie FredricksonPerry, John N, MD  No Known Allergies   ROS  Constitutional: Negative for fever or weight change.  Respiratory: Negative for cough and shortness of breath.   Cardiovascular: Negative for chest pain or palpitations.  Gastrointestinal: Negative for abdominal pain, no bowel changes.  Musculoskeletal: Negative for gait problem or joint swelling. Skin: Negative for rash.  Neurological: Negative for dizziness or headache.  No other specific complaints in a complete review of systems (except as listed in HPI above).   Objective  Vitals:   01/28/18 1336  BP: 118/86  Pulse: 78  SpO2: 98%   Weight: (!) 327 lb (148.3 kg)  Height: 6' (1.829 m)    Body mass index is 44.35 kg/m.  Physical Exam Vital signs reviewed. Constitutional: Patient appears well-developed and well-nourished. No distress.  HENT: Head: Normocephalic and atraumatic. Ears: B TMs ok, no erythema or effusion; Nose: Nose normal. Mouth/Throat: Oropharynx is clear and moist. No oropharyngeal exudate.  Eyes: Conjunctivae and EOM are normal. Pupils are equal, round, and reactive to light. No scleral  icterus.  Neck: Normal range of motion. Neck supple.  No thyromegaly present.  Cardiovascular: Normal rate, regular rhythm and normal heart sounds.  No murmur heard. No BLE edema. Distal pulses intact. Pulmonary/Chest: Effort normal and breath sounds normal. No respiratory distress. Abdominal: Soft, no distension. Musculoskeletal: Normal range of motion, no joint effusions. No gross deformities. Left calf without swelling, tenderness, erythema. Neurological: he is alert and oriented to person, place, and time. No cranial nerve deficit. Coordination, balance, strength, speech and gait are normal.  Skin: Skin is warm and dry. No rash noted. No erythema.  Psychiatric: Patient has a normal mood and affect. behavior is normal. Judgment and thought content normal.   Assessment & Plan RTC in 1 year for CPE TSH, A1c WNL 06/08/17  Pain of left calf Could be MSK problem but should r/o DVT Labs today instructed to schedule F/U with SM provider in our clinic for further evaluation F/U with further recommendations pending lab results - Comprehensive metabolic panel; Future - D-Dimer, Quantitative; Future

## 2018-01-28 NOTE — Assessment & Plan Note (Signed)
Continue allopurinol Update labs - allopurinol (ZYLOPRIM) 100 MG tablet; Take 1 tablet (100 mg total) by mouth daily.  Dispense: 30 tablet; Refill: 5 - CBC; Future - Comprehensive metabolic panel; Future - Uric acid; Future

## 2018-01-28 NOTE — Assessment & Plan Note (Signed)
-  USPSTF grade A and B recommendations reviewed with patient; age-appropriate recommendations, preventive care, screening tests, etc discussed and encouraged; healthy living encouraged; see AVS for patient education given to patient -Red flags and when to present for emergency care or RTC including fever >101.53F, chest pain, shortness of breath, new/worsening/un-resolving symptoms, reviewed with patient at time of visit. Follow up and care instructions discussed and provided in AVS.  -Reviewed Health Maintenance: Declines HIV screening Declines Flu vacc  Screening for cholesterol level He is fasting - Lipid panel; Future  Encounter for general adult medical examination with abnormal findings - CBC; Future - Comprehensive metabolic panel; Future - Lipid panel; Future

## 2018-01-28 NOTE — Assessment & Plan Note (Signed)
Continue protonix 06/08/17 Mg WNL - CBC; Future

## 2018-01-29 ENCOUNTER — Ambulatory Visit (INDEPENDENT_AMBULATORY_CARE_PROVIDER_SITE_OTHER): Payer: BLUE CROSS/BLUE SHIELD | Admitting: Family Medicine

## 2018-01-29 ENCOUNTER — Ambulatory Visit: Payer: Self-pay

## 2018-01-29 ENCOUNTER — Encounter: Payer: Self-pay | Admitting: Family Medicine

## 2018-01-29 VITALS — BP 126/84 | HR 68 | Ht 72.0 in | Wt 328.0 lb

## 2018-01-29 DIAGNOSIS — M79662 Pain in left lower leg: Secondary | ICD-10-CM | POA: Diagnosis not present

## 2018-01-29 LAB — D-DIMER, QUANTITATIVE (NOT AT ARMC): D DIMER QUANT: 0.5 ug{FEU}/mL — AB (ref <0.50)

## 2018-01-29 NOTE — Patient Instructions (Signed)
Good to see you  Please try the exercises. These are how you get improvement of your pain  You can take the mobic  Please try compression on the area  Please try to warm up before playing basketball.  Please follow up in 2-3 weeks if your symptoms are not improving.

## 2018-01-29 NOTE — Progress Notes (Signed)
Nicholas [name] - 37 y.o. male MRN [id number]  Date of birth: 1981-02-12  SUBJECTIVE:  Including CC & ROS.  Chief Complaint  Patient presents with  . Leg Pain    Nicholas [name] is a 37 y.o. male that is calf pain. 1 month ago. States he has injured his calf multiple times.  He noticed the pain while playing basketball.  He quit playing after this.  He has not done any therapy at this point.  No numbness and tingling noted. Walking is painful. Has tried heat, massage and ice for the pain.   Review of Systems  Constitutional: Negative for fever.  HENT: Negative for congestion.   Respiratory: Negative for cough.   Cardiovascular: Negative for chest pain.  Gastrointestinal: Negative for abdominal distention.  Musculoskeletal: Negative for back pain.  Skin: Negative for color change.  Neurological: Negative for weakness.  Hematological: Negative for adenopathy.  Psychiatric/Behavioral: Negative for agitation.    HISTORY: Past Medical, Surgical, Social, and Family History Reviewed & Updated per EMR.   Pertinent Historical Findings include:  Past Medical History:  Diagnosis Date  . Anxiety   . Arthritis   . Depression   . Gout   . Hypertension     Past Surgical History:  Procedure Laterality Date  . APPENDECTOMY    . CHOLECYSTECTOMY    . FOOT SURGERY Left   . GASTRIC BYPASS      No Known Allergies  Family History  Problem Relation Age of Onset  . Hepatitis C Mother   . Bone cancer Father   . Pancreatic cancer Paternal Uncle      Social History   Socioeconomic History  . Marital status: Married    Spouse name: Not on file  . Number of children: 3  . Years of education: 14  . Highest education level: Not on file  Occupational History  . Occupation: Truck Runner, broadcasting/film/video  . Financial resource strain: Not on file  . Food insecurity:    Worry: Not on file    Inability: Not on file  . Transportation needs:    Medical: Not on file    Non-medical:  Not on file  Tobacco Use  . Smoking status: Never Smoker  . Smokeless tobacco: Never Used  Substance and Sexual Activity  . Alcohol use: Yes    Comment: occasionally  . Drug use: No  . Sexual activity: Yes  Lifestyle  . Physical activity:    Days per week: Not on file    Minutes per session: Not on file  . Stress: Not on file  Relationships  . Social connections:    Talks on phone: Not on file    Gets together: Not on file    Attends religious service: Not on file    Active member of club or organization: Not on file    Attends meetings of clubs or organizations: Not on file    Relationship status: Not on file  . Intimate partner violence:    Fear of current or ex partner: Not on file    Emotionally abused: Not on file    Physically abused: Not on file    Forced sexual activity: Not on file  Other Topics Concern  . Not on file  Social History Narrative   Fun: Basketball, weight lifting, watch movies      PHYSICAL EXAM:  VS: BP 126/84 (BP Location: Left Arm, Patient Position: Sitting)   Pulse 68   Ht 6' (1.829 m)  Wt (!) 328 lb (148.8 kg)   SpO2 92%   BMI 44.48 kg/m  Physical Exam Gen: NAD, alert, cooperative with exam, well-appearing ENT: normal lips, normal nasal mucosa,  Eye: normal EOM, normal conjunctiva and lids CV:  no edema, +2 pedal pulses   Resp: no accessory muscle use, non-labored,  GI: no masses or tenderness, no hernia  Skin: no rashes, no areas of induration  Neuro: normal tone, normal sensation to touch Psych:  normal insight, alert and oriented MSK:  Left calf: Tenderness palpation of the medial gastroc near the musculotendinous junction. No abnormal swelling. Normal strength resistance with plantarflexion and dorsiflexion. Plantarflexion with [name] test. Neurovascularly intact  Limited ultrasound: Left calf:  Normal-appearing Achilles. There appears to be a separation between the medial gastroc and soleus in the form of a hypoechoic  change.  Summary: Findings suggestive of tennis leg.  Ultrasound and interpretation by Clare GandyJeremy Shatina Streets, MD      ASSESSMENT & PLAN:   Pain of left calf Still having pain after 4 weeks. Seems to have changes on US between Med gastroc and solues  - counseled on HEP  - counseled on compression  - if no improvement consider PT

## 2018-01-31 DIAGNOSIS — M79662 Pain in left lower leg: Secondary | ICD-10-CM | POA: Insufficient documentation

## 2018-01-31 NOTE — Assessment & Plan Note (Signed)
Still having pain after 4 weeks. Seems to have changes on US between Med gastroc and solues  - counseled on HEP  - counseled on compression  - if no improvement consider PT

## 2018-02-16 ENCOUNTER — Telehealth: Payer: Self-pay | Admitting: Nurse Practitioner

## 2018-02-16 NOTE — Telephone Encounter (Signed)
Copied from CRM [id number]. Topic: General - Other >> Feb 16, 2018  8:10 AM Gaynelle Adu wrote: Reason for CRM: patient is calling to advise he is experience gout, he has a history of this and stated the medication that he is currently taking is not helping him. His best contact number is [phone]

## 2018-02-16 NOTE — Telephone Encounter (Signed)
Ok to Rf Colchicine 0.6 mg? This has been rx'd before for flares. Please advise.

## 2018-02-17 MED ORDER — COLCHICINE 0.6 MG PO TABS: 0.6000 mg | ORAL_TABLET | Freq: Every day | ORAL | 0 refills | Status: DC

## 2018-02-17 NOTE — Telephone Encounter (Signed)
Called patient to advise of below. No answer. Left detailed message informing pt of below.

## 2018-02-17 NOTE — Telephone Encounter (Signed)
colchicine sent, follow up for OV if the flare does not resolve

## 2018-02-18 ENCOUNTER — Telehealth: Payer: Self-pay | Admitting: Nurse Practitioner

## 2018-02-18 NOTE — Telephone Encounter (Signed)
Pts COLCHICINE is not covered by insurance, pharmacy faxed over paper stating this, was it received?  Does the medication need to be changed? Does he needs PA? We have MITIGARE samples here in the office if you would like for patient to have those, please advise and call pharmacy if you received fax or not

## 2018-02-18 NOTE — Telephone Encounter (Signed)
Mitigare samples upfront for pt. Pt informed.

## 2018-02-18 NOTE — Telephone Encounter (Signed)
Telephone call to [facility] Inquired if they received Rx request for colchine 0.6 . Phamacist states they got the request .  States his insurance does not cover the medication. Faxed the office to see if PCP wanted another medication.  Waiting for reply.

## 2018-02-18 NOTE — Telephone Encounter (Signed)
I have not received a PA request yet. Do you want patient to have Mitigare samples or attempt PA with insurance?

## 2018-02-18 NOTE — Telephone Encounter (Signed)
mitigare sample  is fine

## 2018-04-16 ENCOUNTER — Other Ambulatory Visit: Payer: Self-pay | Admitting: Nurse Practitioner

## 2018-04-16 DIAGNOSIS — F411 Generalized anxiety disorder: Secondary | ICD-10-CM

## 2018-04-16 NOTE — Telephone Encounter (Signed)
Patient's wife calling and states that they have to go out of town for a death in the family and the patient is needing this prescription before they leave tomorrow. States that they contacted the pharmacy 3 days ago and had not heard anything back CB#: [phone] [id number] - [place], [place] - [address]

## 2018-04-16 NOTE — Telephone Encounter (Signed)
[place] Controlled Database Checked Last filled: 03/15/18 # 30 LOV w/you: 01/28/18 Next appt w/you: None

## 2018-05-14 ENCOUNTER — Other Ambulatory Visit: Payer: Self-pay | Admitting: Nurse Practitioner

## 2018-05-14 DIAGNOSIS — F411 Generalized anxiety disorder: Secondary | ICD-10-CM

## 2018-05-17 NOTE — Telephone Encounter (Signed)
[place] Controlled Database Checked Last filled: 04/16/18 # 30 LOV w/you: 01/28/18 Next appt w/you: None

## 2018-06-09 ENCOUNTER — Other Ambulatory Visit: Payer: Self-pay | Admitting: Nurse Practitioner

## 2018-06-09 DIAGNOSIS — F411 Generalized anxiety disorder: Secondary | ICD-10-CM

## 2018-06-09 NOTE — Telephone Encounter (Signed)
 Controlled Database Checked Last filled: 05/17/18 # 30 LOV w/you: 01/28/18 Next appt w/you: None

## 2018-07-12 ENCOUNTER — Other Ambulatory Visit: Payer: Self-pay | Admitting: Nurse Practitioner

## 2018-07-12 ENCOUNTER — Other Ambulatory Visit: Payer: Self-pay | Admitting: *Deleted

## 2018-07-12 DIAGNOSIS — F411 Generalized anxiety disorder: Secondary | ICD-10-CM

## 2018-07-12 MED ORDER — LORAZEPAM 1 MG PO TABS: ORAL_TABLET | ORAL | 0 refills | Status: DC

## 2018-07-12 NOTE — Telephone Encounter (Signed)
[place] Controlled Database Checked Last filled: 06/16/18 # 30 LOV w/you: 01/29/19 Next appt w/you: None

## 2018-08-11 ENCOUNTER — Other Ambulatory Visit: Payer: Self-pay | Admitting: *Deleted

## 2018-08-11 DIAGNOSIS — F411 Generalized anxiety disorder: Secondary | ICD-10-CM

## 2018-08-11 NOTE — Telephone Encounter (Signed)
Left mess for patient to call back to schedule virtual visit. CRM created.

## 2018-08-11 NOTE — Telephone Encounter (Signed)
He is going to need a virtual visit to get further refills; his last OV here was last September; please set him up for Friday with me or tomorrow with another provider if that's better for him.

## 2018-08-11 NOTE — Telephone Encounter (Signed)
[place] Controlled Database Checked Last filled: 07/13/18 # 30 LOV w/PCP: 01/28/18 Next appt: None

## 2018-08-25 DIAGNOSIS — Z79899 Other long term (current) drug therapy: Secondary | ICD-10-CM | POA: Diagnosis not present

## 2018-08-25 DIAGNOSIS — R5383 Other fatigue: Secondary | ICD-10-CM | POA: Diagnosis not present

## 2018-08-25 DIAGNOSIS — Z0000 Encounter for general adult medical examination without abnormal findings: Secondary | ICD-10-CM | POA: Diagnosis not present

## 2018-08-26 ENCOUNTER — Other Ambulatory Visit: Payer: Self-pay

## 2018-08-26 DIAGNOSIS — F411 Generalized anxiety disorder: Secondary | ICD-10-CM

## 2018-08-26 NOTE — Telephone Encounter (Signed)
Requested medication (s) are due for refill today: yes  Requested medication (s) are on the active medication list: yes  Last refill:  07/12/18  Future visit scheduled: No  Notes to clinic: last written by A Aura Camps NP. Medication not delegated    Requested Prescriptions  Pending Prescriptions Disp Refills   LORazepam (ATIVAN) 1 MG tablet 30 tablet 0    Sig: Take 1 tablet by mouth every day at bedtime for anxiety     Not Delegated - Psychiatry:  Anxiolytics/Hypnotics Failed - 08/26/2018  4:54 PM      Failed - This refill cannot be delegated      Failed - Urine Drug Screen completed in last 360 days.      Failed - Valid encounter within last 6 months    Recent Outpatient Visits          6 months ago Pain of left calf   [facility] -Celine Mans, Marye Round, MD   7 months ago Pain of left calf   [facility] -[place] Aura Camps, Audie Box, NP   1 year ago Generalized anxiety disorder   [facility] -Einar Crow, NP   1 year ago Right hip pain   [facility] -Celine Mans, Marye Round, MD   1 year ago Dysuria   [facility] -Shanna Cisco, Allyne Gee, FNP

## 2018-08-27 MED ORDER — LORAZEPAM 1 MG PO TABS: ORAL_TABLET | ORAL | 0 refills | Status: DC

## 2018-08-28 DIAGNOSIS — E782 Mixed hyperlipidemia: Secondary | ICD-10-CM | POA: Diagnosis not present

## 2018-08-28 DIAGNOSIS — K219 Gastro-esophageal reflux disease without esophagitis: Secondary | ICD-10-CM | POA: Diagnosis not present

## 2018-08-28 DIAGNOSIS — R51 Headache: Secondary | ICD-10-CM | POA: Diagnosis not present

## 2018-08-28 DIAGNOSIS — R5383 Other fatigue: Secondary | ICD-10-CM | POA: Diagnosis not present

## 2018-09-21 ENCOUNTER — Other Ambulatory Visit: Payer: Self-pay | Admitting: Nurse Practitioner

## 2018-09-21 DIAGNOSIS — F411 Generalized anxiety disorder: Secondary | ICD-10-CM

## 2018-09-21 NOTE — Telephone Encounter (Signed)
Copied from CRM [id number]. Topic: Quick Communication - Rx Refill/Question >> Sep 21, 2018  3:42 PM Maia Petties wrote: Medication: LORazepam (ATIVAN) 1 MG tablet - pt has 1-2 left  Has the patient contacted their pharmacy? Yes but changing pharmacies Preferred Pharmacy (with phone number or street name): [id number] - , [place] - [address] AT [facility] [address] [facility] [address] [phone] (Phone) [phone] (Fax)

## 2018-09-22 NOTE — Telephone Encounter (Signed)
Ok with me 

## 2018-09-22 NOTE — Telephone Encounter (Signed)
I spoke to patient's wife. I informed her since Alphonse Guild, NP is no longer with [facility], patient will need to establish with someone else. She states patient would like to stay at [facility] to transfer to you for PCP?   [place] Controlled Database Checked Last filled: 08/27/18 # 30 LOV w/[place]: 01/28/18 Next appt: None

## 2018-09-23 MED ORDER — LORAZEPAM 1 MG PO TABS: ORAL_TABLET | ORAL | 0 refills | Status: DC

## 2018-09-23 NOTE — Telephone Encounter (Signed)
Sorry- ok to Rf Lorazepam?  I will get him scheduled with you for TOC.

## 2018-09-23 NOTE — Telephone Encounter (Signed)
Left detailed message for patient to call back to schedule TOC appt with Dr. Jonny Ruiz. CRM created.

## 2018-09-23 NOTE — Telephone Encounter (Signed)
Ativan done erx  Please contact pt regarding need for ROV for further refills  Today would be fine for doxy or inperson

## 2018-11-09 ENCOUNTER — Ambulatory Visit (INDEPENDENT_AMBULATORY_CARE_PROVIDER_SITE_OTHER): Payer: BC Managed Care – PPO | Admitting: Podiatry

## 2018-11-09 ENCOUNTER — Other Ambulatory Visit: Payer: Self-pay

## 2018-11-09 ENCOUNTER — Ambulatory Visit (INDEPENDENT_AMBULATORY_CARE_PROVIDER_SITE_OTHER): Payer: BC Managed Care – PPO

## 2018-11-09 ENCOUNTER — Encounter: Payer: Self-pay | Admitting: Podiatry

## 2018-11-09 VITALS — Temp 98.8°F

## 2018-11-09 DIAGNOSIS — M2141 Flat foot [pes planus] (acquired), right foot: Secondary | ICD-10-CM | POA: Diagnosis not present

## 2018-11-09 DIAGNOSIS — M722 Plantar fascial fibromatosis: Secondary | ICD-10-CM

## 2018-11-09 DIAGNOSIS — M2142 Flat foot [pes planus] (acquired), left foot: Secondary | ICD-10-CM

## 2018-11-09 MED ORDER — MELOXICAM 15 MG PO TABS: 15.0000 mg | ORAL_TABLET | Freq: Every day | ORAL | 3 refills | Status: DC

## 2018-11-09 MED ORDER — METHYLPREDNISOLONE 4 MG PO TBPK: ORAL_TABLET | ORAL | 0 refills | Status: DC

## 2018-11-09 NOTE — Patient Instructions (Signed)

## 2018-11-09 NOTE — Progress Notes (Signed)
Subjective:   Patient ID: Nicholas [name], male   DOB: 38 y.o.   MRN: [id number]   HPI He is complaining of several weeks of bilateral heel pain left greater than right history of endoscopic fasciotomy with [place] many years ago.  At this point he states that he has lost his orthotics in his feet are becoming painful again.   ROS: He denies fever chills nausea vomiting muscle aches pains calf pain back pain chest pain shortness of breath.      Objective:  Physical Exam  Pes planus bilateral is noted.  Pulses are strong and palpable.  Neurologic sensorium is intact dT reflexes are intact muscle strength is normal and symmetrical bilateral.  He has pain on palpation medial calcaneal tubercles bilateral.  No open lesions or wounds no osseous abnormalities.  Radiographs taken today demonstrate an osseously mature individual with pes planus and soft tissue increase in density at the plantar fascial calcaneal insertion sites bilaterally.  Early osteoarthritic changes dorsal aspect of the bilateral foot.     Assessment:  Plantar fasciitis bilateral.  Pes planus bilateral.     Plan:  Discussed etiology pathology conservative versus surgical therapies.  At this point I will start him on methylprednisolone to be followed by meloxicam which he is already taking.  We will go ahead and refill that for him.  I also injected him bilaterally with 20 mg of Kenalog 5 mg of Marcaine for maximal tenderness bilaterally.  He tolerated the procedure well.  He also saw Liliane Channel today for new set of orthotics.  I will follow-up with him in approximately 1 month to make sure he is doing well.

## 2018-11-15 ENCOUNTER — Other Ambulatory Visit: Payer: Self-pay | Admitting: *Deleted

## 2018-11-15 DIAGNOSIS — M109 Gout, unspecified: Secondary | ICD-10-CM

## 2018-11-15 MED ORDER — ALLOPURINOL 100 MG PO TABS: 100.0000 mg | ORAL_TABLET | Freq: Every day | ORAL | 0 refills | Status: DC

## 2019-01-02 NOTE — Progress Notes (Deleted)
[name] [address] [place], [place] [address] Phone: [phone] Subjective:    I'm seeing this patient by the request  of:    CC:   [id number]  Nicholas [name] is a 38 y.o. male coming in with complaint of ***  Onset-  Location Duration-  Character- Aggravating factors- Reliving factors-  Therapies tried-  Severity-     Past Medical History:  Diagnosis Date  . Anxiety   . Arthritis   . Depression   . Gout   . Hypertension    Past Surgical History:  Procedure Laterality Date  . APPENDECTOMY    . CHOLECYSTECTOMY    . FOOT SURGERY Left   . GASTRIC BYPASS     Social History   Socioeconomic History  . Marital status: Married    Spouse name: Not on file  . Number of children: 3  . Years of education: 51  . Highest education level: Not on file  Occupational History  . Occupation: [employer] operational officer  . Financial resource strain: Not on file  . Food insecurity    Worry: Not on file    Inability: Not on file  . Transportation needs    Medical: Not on file    Non-medical: Not on file  Tobacco Use  . Smoking status: Never Smoker  . Smokeless tobacco: Never Used  Substance and Sexual Activity  . Alcohol use: Yes    Comment: occasionally  . Drug use: No  . Sexual activity: Yes  Lifestyle  . Physical activity    Days per week: Not on file    Minutes per session: Not on file  . Stress: Not on file  Relationships  . Social Herbalist on phone: Not on file    Gets together: Not on file    Attends religious service: Not on file    Active member of club or organization: Not on file    Attends meetings of clubs or organizations: Not on file    Relationship status: Not on file  Other Topics Concern  . Not on file  Social History Narrative   Fun: Basketball, weight lifting, watch movies    No Known Allergies Family History  Problem Relation Age of Onset  . Hepatitis C Mother   . Bone cancer  Father   . Pancreatic cancer Paternal Uncle     Current Outpatient Medications (Endocrine & Metabolic):  .  methylPREDNISolone (MEDROL DOSEPAK) 4 MG TBPK tablet, 6 day dose pack - take as directed       Current Outpatient Medications (Analgesics):  .  allopurinol (ZYLOPRIM) 100 MG tablet, Take 1 tablet (100 mg total) by mouth daily. **NEED APPOINTMENT FOR FUTURE REFILLS** .  colchicine 0.6 MG tablet, Take 1 tablet (0.6 mg total) by mouth daily. Starting tomorrow 0.6 mg once or twice daily until flare resolves. .  meloxicam (MOBIC) 15 MG tablet, TAKE 1 TABLET BY MOUTH EVERY DAY .  meloxicam (MOBIC) 15 MG tablet, Take 1 tablet (15 mg total) by mouth daily.     Current Outpatient Medications (Other):  .  hydrOXYzine (ATARAX/VISTARIL) 25 MG tablet, TAKE 1 TABLET BY MOUTH THREE TIMES A DAY AS NEEDED FOR ANXIETY .  LORazepam (ATIVAN) 1 MG tablet, Take 1 tablet by mouth every day at bedtime for anxiety .  omeprazole (PRILOSEC) 20 MG capsule, Take by mouth daily.  Current Facility-Administered Medications (Other):  .  0.9 %  sodium chloride infusion .  0.9 %  sodium chloride infusion    Past medical history, social, surgical and family history all reviewed in electronic medical record.  No pertanent information unless stated regarding to the chief complaint.   Review of Systems:  No headache, visual changes, nausea, vomiting, diarrhea, constipation, dizziness, abdominal pain, skin rash, fevers, chills, night sweats, weight loss, swollen lymph nodes, body aches, joint swelling, muscle aches, chest pain, shortness of breath, mood changes.   Objective  There were no vitals taken for this visit. Systems examined below as of    General: No apparent distress alert and oriented x3 mood and affect normal, dressed appropriately.  HEENT: Pupils equal, extraocular movements intact  Respiratory: Patient's speak in full sentences and does not appear short of breath  Cardiovascular: No lower  extremity edema, non tender, no erythema  Skin: Warm dry intact with no signs of infection or rash on extremities or on axial skeleton.  Abdomen: Soft nontender  Neuro: Cranial nerves II through XII are intact, neurovascularly intact in all extremities with 2+ DTRs and 2+ pulses.  Lymph: No lymphadenopathy of posterior or anterior cervical chain or axillae bilaterally.  Gait normal with good balance and coordination.  MSK:  Non tender with full range of motion and good stability and symmetric strength and tone of shoulders, elbows, wrist, hip, knee and ankles bilaterally.     Impression and Recommendations:     This case required medical decision making of moderate complexity. The above documentation has been reviewed and is accurate and complete Judi SaaZachary M Bulmaro Feagans, DO       Note: This dictation was prepared with Dragon dictation along with smaller phrase technology. Any transcriptional errors that result from this process are unintentional.

## 2019-01-03 ENCOUNTER — Ambulatory Visit: Payer: BLUE CROSS/BLUE SHIELD | Admitting: Family Medicine

## 2019-01-03 DIAGNOSIS — Z0289 Encounter for other administrative examinations: Secondary | ICD-10-CM

## 2019-01-14 ENCOUNTER — Other Ambulatory Visit: Payer: Self-pay | Admitting: Internal Medicine

## 2019-01-14 DIAGNOSIS — M109 Gout, unspecified: Secondary | ICD-10-CM

## 2019-03-01 ENCOUNTER — Other Ambulatory Visit: Payer: Self-pay | Admitting: Internal Medicine

## 2019-03-01 DIAGNOSIS — M109 Gout, unspecified: Secondary | ICD-10-CM

## 2019-03-07 ENCOUNTER — Telehealth: Payer: Self-pay | Admitting: *Deleted

## 2019-03-07 NOTE — Telephone Encounter (Signed)
Copied from Mildred [id number]. Topic: General - Other >> Mar 01, 2019  1:13 PM Burchel, Abbi R wrote: Reason for CRM: Pt's wife called to request that pt's rx (LORazepam (ATIVAN) 1 MG tablet) be listed as discontinued as he is no longer taking it.  Please advise: [phone]

## 2019-03-07 NOTE — Telephone Encounter (Signed)
Patient's medication list updated to reflect below.

## 2019-04-14 ENCOUNTER — Other Ambulatory Visit: Payer: Self-pay | Admitting: Internal Medicine

## 2019-04-14 DIAGNOSIS — M109 Gout, unspecified: Secondary | ICD-10-CM

## 2019-06-04 ENCOUNTER — Other Ambulatory Visit: Payer: Self-pay | Admitting: Internal Medicine

## 2019-06-04 DIAGNOSIS — M109 Gout, unspecified: Secondary | ICD-10-CM

## 2019-06-06 ENCOUNTER — Ambulatory Visit: Payer: Self-pay | Attending: Internal Medicine

## 2019-06-06 DIAGNOSIS — Z20822 Contact with and (suspected) exposure to covid-19: Secondary | ICD-10-CM | POA: Insufficient documentation

## 2019-06-07 LAB — NOVEL CORONAVIRUS, NAA: SARS-CoV-2, NAA: NOT DETECTED

## 2019-08-28 ENCOUNTER — Other Ambulatory Visit: Payer: Self-pay | Admitting: Internal Medicine

## 2019-08-28 DIAGNOSIS — M109 Gout, unspecified: Secondary | ICD-10-CM

## 2019-09-09 ENCOUNTER — Other Ambulatory Visit: Payer: Self-pay | Admitting: Internal Medicine

## 2019-09-09 DIAGNOSIS — M109 Gout, unspecified: Secondary | ICD-10-CM

## 2019-10-02 ENCOUNTER — Other Ambulatory Visit: Payer: Self-pay | Admitting: Internal Medicine

## 2019-10-02 DIAGNOSIS — M109 Gout, unspecified: Secondary | ICD-10-CM

## 2020-01-17 ENCOUNTER — Other Ambulatory Visit: Payer: Self-pay | Admitting: Podiatry

## 2020-04-27 ENCOUNTER — Other Ambulatory Visit: Payer: Self-pay

## 2020-04-27 ENCOUNTER — Emergency Department (HOSPITAL_COMMUNITY): Payer: Self-pay

## 2020-04-27 ENCOUNTER — Encounter (HOSPITAL_COMMUNITY): Payer: Self-pay | Admitting: Pharmacy Technician

## 2020-04-27 ENCOUNTER — Emergency Department (HOSPITAL_COMMUNITY)
Admission: EM | Admit: 2020-04-27 | Discharge: 2020-04-27 | Disposition: A | Discharge status: Home / Self Care | Payer: Self-pay | Attending: Emergency Medicine | Admitting: Emergency Medicine

## 2020-04-27 DIAGNOSIS — I10 Essential (primary) hypertension: Secondary | ICD-10-CM | POA: Insufficient documentation

## 2020-04-27 DIAGNOSIS — M109 Gout, unspecified: Secondary | ICD-10-CM | POA: Insufficient documentation

## 2020-04-27 MED ORDER — HYDROCODONE-ACETAMINOPHEN 5-325 MG PO TABS
1.0000 | ORAL_TABLET | Freq: Once | ORAL | Status: AC
  Administered 2020-04-27: 1 via ORAL
  Filled 2020-04-27: qty 1

## 2020-04-27 MED ORDER — DEXAMETHASONE 4 MG PO TABS
6.0000 mg | ORAL_TABLET | Freq: Once | ORAL | Status: AC
  Administered 2020-04-27: 6 mg via ORAL
  Filled 2020-04-27: qty 2

## 2020-04-27 MED ORDER — HYDROCODONE-ACETAMINOPHEN 5-325 MG PO TABS: 1.0000 | ORAL_TABLET | Freq: Four times a day (QID) | ORAL | 0 refills | Status: DC | PRN

## 2020-04-27 NOTE — ED Provider Notes (Signed)
[facility] EMERGENCY DEPARTMENT Provider Note   CSN: [id number] Arrival date & time: 04/27/20  0915     History Chief Complaint  Patient presents with  . Gout    Nicholas [name] is a 39 y.o. male with past medical history significant for anxiety, arthritis, depression, gout, hypertension.  HPI Patient presents to emergency department today with chief complaint of gout flare x 5 days. He has pain in his right pinky and right ankle. He was seen at urgent care x2 days ago for the same.  He was discharged with prescriptions for colchicine and allopurinol. He states despite taking those his pain has only minimally improved. He states he was told his pain should be much improved by this point. He describes the pain as burning and throbbing. He rates pain 4/10 in severity. He denies any known injury to finger or ankle.  Pain is worse with ambulation or movement. Pain is localized, do not radiate. He denies fever, chills, numbness, tingling, weakness.        Past Medical History:  Diagnosis Date  . Anxiety   . Arthritis   . Depression   . Gout   . Hypertension     Patient Active Problem List   Diagnosis Date Noted  . Pain of left calf 01/31/2018  . Gout 01/28/2018  . Right hip pain 07/22/2017  . Routine adult health maintenance 09/22/2016  . Obesity 09/22/2016  . GERD (gastroesophageal reflux disease) 08/15/2015  . Generalized anxiety disorder 07/16/2015  . Sleep disturbance 07/16/2015  . Seasonal allergies 07/16/2015    Past Surgical History:  Procedure Laterality Date  . APPENDECTOMY    . CHOLECYSTECTOMY    . FOOT SURGERY Left   . GASTRIC BYPASS         Family History  Problem Relation Age of Onset  . Hepatitis C Mother   . Bone cancer Father   . Pancreatic cancer Paternal Uncle     Social History   Tobacco Use  . Smoking status: Never Smoker  . Smokeless tobacco: Never Used  Vaping Use  . Vaping Use: Never used  Substance Use  Topics  . Alcohol use: Yes    Comment: occasionally  . Drug use: No    Home Medications Prior to Admission medications   Medication Sig Start Date End Date Taking? Authorizing Provider  allopurinol (ZYLOPRIM) 100 MG tablet TAKE 1 TABLET BY MOUTH DAILY. **NEED APPOINTMENT FOR FUTURE REFILLS** 06/06/19   Plotnikov, Georgina Quint, MD  colchicine 0.6 MG tablet Take 1 tablet (0.6 mg total) by mouth daily. Starting tomorrow 0.6 mg once or twice daily until flare resolves. 02/17/18   Evaristo Bury, NP  HYDROcodone-acetaminophen (NORCO/VICODIN) 5-325 MG tablet Take 1 tablet by mouth every 6 (six) hours as needed for severe pain. 04/27/20   Walisiewicz, Yvonna Alanis E, PA-C  hydrOXYzine (ATARAX/VISTARIL) 25 MG tablet TAKE 1 TABLET BY MOUTH THREE TIMES A DAY AS NEEDED FOR ANXIETY 08/25/18   [provider]  meloxicam (MOBIC) 15 MG tablet TAKE 1 TABLET BY MOUTH EVERY DAY 11/16/17   Hyatt, Max T, DPM  meloxicam (MOBIC) 15 MG tablet TAKE 1 TABLET BY MOUTH EVERY DAY 01/17/20   Hyatt, Max T, DPM  methylPREDNISolone (MEDROL DOSEPAK) 4 MG TBPK tablet 6 day dose pack - take as directed 11/09/18   Hyatt, Max T, DPM  omeprazole (PRILOSEC) 20 MG capsule Take by mouth daily. 08/28/18   [provider]    Allergies    Patient has  no known allergies.  Review of Systems   Review of Systems All other systems are reviewed and are negative for acute change except as noted in the HPI.  Physical Exam Updated Vital Signs BP (!) 141/105 (BP Location: Left Arm)   Pulse (!) 101   Temp 98.3 F (36.8 C) (Oral)   Resp 18   Ht 6\' 2"  (1.88 m)   Wt (!) 154.2 kg   SpO2 97%   BMI 43.65 kg/m   Physical Exam Vitals and nursing note reviewed.  Constitutional:      Appearance: He is well-developed. He is not ill-appearing or toxic-appearing.  HENT:     Head: Normocephalic and atraumatic.     Nose: Nose normal.  Eyes:     General: No scleral icterus.       Right eye: No discharge.        Left eye: No  discharge.     Conjunctiva/sclera: Conjunctivae normal.  Neck:     Vascular: No JVD.  Cardiovascular:     Rate and Rhythm: Normal rate and regular rhythm.     Pulses: Normal pulses.          Radial pulses are 2+ on the right side and 2+ on the left side.     Heart sounds: Normal heart sounds.     Comments: HR 89-97 during exam Pulmonary:     Effort: Pulmonary effort is normal.     Breath sounds: Normal breath sounds.  Abdominal:     General: There is no distension.  Musculoskeletal:        General: Normal range of motion.     Cervical back: Normal range of motion.     Comments: Swelling to PIP joint of right pinky finger. Full ROM. No warmth or overlying wound.  Swelling of  lateral right ankle. No TTP. No warmth.  No TTP or swelling of fore foot or calf. No break in skin. Good pedal pulse and cap refill of all toes. Wiggling toes without difficulty.   Skin:    General: Skin is warm and dry.  Neurological:     Mental Status: He is oriented to person, place, and time.     GCS: GCS eye subscore is 4. GCS verbal subscore is 5. GCS motor subscore is 6.     Comments: Fluent speech, no facial droop.  Psychiatric:        Behavior: Behavior normal.     ED Results / Procedures / Treatments   Labs (all labs ordered are listed, but only abnormal results are displayed) Labs Reviewed - No data to display  EKG None  Radiology DG Ankle Complete Right  Result Date: 04/27/2020 CLINICAL DATA:  pain. hx gout, meds not helping EXAM: RIGHT ANKLE - COMPLETE 3+ VIEW COMPARISON:  09/20/2010. FINDINGS: Mild soft tissue swelling. Normal alignment with approximation of the joints. No fracture or focal osseous lesion. Dorsal midfoot degenerative spurring. IMPRESSION: Dorsal midfoot osteoarthrosis.  No focal erosion or acute finding. Mild soft tissue swelling. Electronically Signed   By: 11/20/2010 M.D.   On: 04/27/2020 13:03   DG Finger Little Right  Result Date: 04/27/2020 CLINICAL  DATA:  Right small finger pain and swelling. No injury. History of gout. EXAM: RIGHT LITTLE FINGER 2+V COMPARISON:  Right hand x-rays dated January 13, 2017. FINDINGS: No acute fracture or dislocation. Chronic juxta-articular erosion along the radial aspect of the fifth proximal phalanx head. No new erosions. Joint spaces are preserved. Bone mineralization is normal. Soft  tissue swelling of the small finger. IMPRESSION: 1. Soft tissue swelling of the small finger. No acute osseous abnormality. 2. Chronic erosion of the fifth proximal phalanx head consistent with history of gout. No new erosive changes. Electronically Signed   By: Obie Dredge M.D.   On: 04/27/2020 13:06    Procedures Procedures (including critical care time)  Medications Ordered in ED Medications  HYDROcodone-acetaminophen (NORCO/VICODIN) 5-325 MG per tablet 1 tablet (1 tablet Oral Given 04/27/20 1230)  dexamethasone (DECADRON) tablet 6 mg (6 mg Oral Given 04/27/20 1314)    ED Course  I have reviewed the triage vital signs and the nursing notes.  Pertinent labs & imaging results that were available during my care of the patient were reviewed by me and considered in my medical decision making (see chart for details).    MDM Rules/Calculators/A&P                          History provided by patient with additional history obtained from chart review.    39 yo male presenting with gout flare of right pinky finger and ankle, no trauma. Has history of the same. Afebrile, HDS. On exam he has swelling of PIP joint right pinky, full ROM. No exam findings to suggest flexor tenosynovitis. Swelling of right ankle over lateral right ankle. Joint is intact, will ROM. Unlikely septic arthritis. Ambulates with normal gait. Xray of right pinky finger and right ankle without fracture or dislocation. Findings consistent with gout in finger. Patient given dose of pain medicine and decadron in the ED. Will discharge with short course of  chronic pain medicine for severe pain only. I have reviewed the PDMP during this encounter. He does not have current/recent narcotic prescription.  The patient appears reasonably screened and/or stabilized for discharge and I doubt any other medical condition or other [facility] requiring further screening, evaluation, or treatment in the ED at this time prior to discharge. The patient is safe for discharge with strict return precautions discussed. Recommend pcp follow up if symptoms persist.   Portions of this note were generated with Dragon dictation software. Dictation errors may occur despite best attempts at proofreading.    Final Clinical Impression(s) / ED Diagnoses Final diagnoses:  Acute gout of multiple sites, unspecified cause    Rx / DC Orders ED Discharge Orders         Ordered    HYDROcodone-acetaminophen (NORCO/VICODIN) 5-325 MG tablet  Every 6 hours PRN        04/27/20 1323           Shanon Ace, PA-C 04/27/20 1339    Jacalyn Lefevre, MD 04/27/20 1442

## 2020-04-27 NOTE — ED Notes (Signed)
Pt discharge instructions reviewed with patient. Pt verbalized understanding. Pt discharged. 

## 2020-04-27 NOTE — Discharge Instructions (Signed)
-  Prescription for hydrocodone sent to your pharmacy.  This is a narcotic pain medicine.  It is for severe pain only.  Do not take it if you are going to be working or driving as it can make you drowsy.  -Keep taking the colchicine and allopurinol.  Follow-up with your primary care doctor for long-term management of gout.  Call them and schedule the next available appointment.  Return to the emergency department if you have any new or worsening symptoms.

## 2020-04-27 NOTE — ED Triage Notes (Signed)
Pt here with reports of gout flare up. Seen on \\Wednesday  at [facility] for the same with no improvement. Pt states he has been out of his gout medications.

## 2021-01-23 ENCOUNTER — Other Ambulatory Visit: Payer: Self-pay | Admitting: Podiatry

## 2021-01-23 NOTE — Telephone Encounter (Signed)
Please advise 

## 2021-08-16 ENCOUNTER — Other Ambulatory Visit (HOSPITAL_BASED_OUTPATIENT_CLINIC_OR_DEPARTMENT_OTHER): Payer: Self-pay

## 2021-08-16 ENCOUNTER — Emergency Department (HOSPITAL_BASED_OUTPATIENT_CLINIC_OR_DEPARTMENT_OTHER)
Admission: EM | Admit: 2021-08-16 | Discharge: 2021-08-16 | Disposition: A | Discharge status: Home / Self Care | Payer: Self-pay | Attending: Emergency Medicine | Admitting: Emergency Medicine

## 2021-08-16 ENCOUNTER — Other Ambulatory Visit: Payer: Self-pay

## 2021-08-16 DIAGNOSIS — M109 Gout, unspecified: Secondary | ICD-10-CM | POA: Insufficient documentation

## 2021-08-16 DIAGNOSIS — I10 Essential (primary) hypertension: Secondary | ICD-10-CM | POA: Insufficient documentation

## 2021-08-16 MED ORDER — PREDNISONE 50 MG PO TABS
60.0000 mg | ORAL_TABLET | Freq: Once | ORAL | Status: AC
  Administered 2021-08-16: 60 mg via ORAL
  Filled 2021-08-16: qty 1

## 2021-08-16 MED ORDER — HYDROCODONE-ACETAMINOPHEN 5-325 MG PO TABS
1.0000 | ORAL_TABLET | Freq: Four times a day (QID) | ORAL | 0 refills | Status: AC | PRN
  Filled 2021-08-16: qty 20, 5d supply, fill #0

## 2021-08-16 MED ORDER — PREDNISONE 10 MG PO TABS
40.0000 mg | ORAL_TABLET | Freq: Every day | ORAL | 0 refills | Status: AC
Start: 2021-08-16 — End: ?
  Filled 2021-08-16: qty 20, 5d supply, fill #0

## 2021-08-16 MED ORDER — HYDROCODONE-ACETAMINOPHEN 5-325 MG PO TABS
1.0000 | ORAL_TABLET | Freq: Once | ORAL | Status: AC
  Administered 2021-08-16: 1 via ORAL
  Filled 2021-08-16: qty 1

## 2021-08-16 NOTE — Discharge Instructions (Addendum)
Take the hydrocodone for the pain.  But very importantly take the prednisone to get the inflammation out as directed.  Make an appointment follow-up with your doctor.  Return for any new or worse symptoms. ?

## 2021-08-16 NOTE — ED Triage Notes (Signed)
Patient complains of left foot pain and left index finger - states has been attributed to gout in the past. Patient states it is too painful to walk - currently in wc.\ ?States last time he was seen, he received a steroid shot that alleviated the pain.   ?

## 2021-08-16 NOTE — ED Provider Notes (Signed)
?[facility] ?Provider Note ? ? ?CSN: [id number] ?Arrival date & time: 08/16/21  1407 ? ?  ? ?History ? ?Chief Complaint  ?Patient presents with  ? Foot Pain  ? ? ?Nicholas [name] is a 41 y.o. male. ? ?Patient with a complaint of typical gout cyst symptoms involving his left ankle and left index finger.  Patient has had difficulty with gout.  He is taking his allopurinol.  And has been watching his diet.  But he still had this flare.  No fall or injuries. ? ?Past medical history sniffing for anxiety gout hypertension and arthritis. ? ? ?  ? ?Home Medications ?Prior to Admission medications   ?Medication Sig Start Date End Date Taking? Authorizing Provider  ?HYDROcodone-acetaminophen (NORCO/VICODIN) 5-325 MG tablet Take 1 tablet by mouth every 6 (six) hours as needed for moderate pain. 08/16/21  Yes Vanetta Mulders, MD  ?predniSONE (DELTASONE) 10 MG tablet Take 4 tablets (40 mg total) by mouth daily. 08/16/21  Yes Vanetta Mulders, MD  ?allopurinol (ZYLOPRIM) 100 MG tablet TAKE 1 TABLET BY MOUTH DAILY. **NEED APPOINTMENT FOR FUTURE REFILLS** 06/06/19   Plotnikov, Georgina Quint, MD  ?colchicine 0.6 MG tablet Take 1 tablet (0.6 mg total) by mouth daily. Starting tomorrow 0.6 mg once or twice daily until flare resolves. 02/17/18   Meryl Dare, NP  ?HYDROcodone-acetaminophen (NORCO/VICODIN) 5-325 MG tablet Take 1 tablet by mouth every 6 (six) hours as needed for severe pain. 04/27/20   Walisiewicz, Yvonna Alanis E, PA-C  ?hydrOXYzine (ATARAX/VISTARIL) 25 MG tablet TAKE 1 TABLET BY MOUTH THREE TIMES A DAY AS NEEDED FOR ANXIETY 08/25/18   [provider]  ?meloxicam (MOBIC) 15 MG tablet TAKE 1 TABLET BY MOUTH EVERY DAY 01/23/21   Hyatt, Max T, DPM  ?methylPREDNISolone (MEDROL DOSEPAK) 4 MG TBPK tablet 6 day dose pack - take as directed 11/09/18   Hyatt, Max T, DPM  ?omeprazole (PRILOSEC) 20 MG capsule Take by mouth daily. 08/28/18   [provider]  ?   ? ?Allergies    ?Patient has  no known allergies.   ? ?Review of Systems   ?Review of Systems  ?Constitutional:  Negative for chills and fever.  ?HENT:  Negative for ear pain and sore throat.   ?Eyes:  Negative for pain and visual disturbance.  ?Respiratory:  Negative for cough and shortness of breath.   ?Cardiovascular:  Negative for chest pain and palpitations.  ?Gastrointestinal:  Negative for abdominal pain and vomiting.  ?Genitourinary:  Negative for dysuria and hematuria.  ?Musculoskeletal:  Positive for joint swelling. Negative for arthralgias and back pain.  ?Skin:  Negative for color change and rash.  ?Neurological:  Negative for seizures and syncope.  ?All other systems reviewed and are negative. ? ?Physical Exam ?Updated Vital Signs ?BP (!) 144/94 (BP Location: Left Arm)   Pulse 75   Temp 98.7 ?F (37.1 ?C)   Resp 19   Ht 1.88 m (6\' 2" )   Wt (!) 145.2 kg   SpO2 93%   BMI 41.09 kg/m?  ?Physical Exam ?Vitals and nursing note reviewed.  ?Constitutional:   ?   General: He is not in acute distress. ?   Appearance: Normal appearance. He is well-developed.  ?HENT:  ?   Head: Normocephalic and atraumatic.  ?Eyes:  ?   Extraocular Movements: Extraocular movements intact.  ?   Conjunctiva/sclera: Conjunctivae normal.  ?   Pupils: Pupils are equal, round, and reactive to light.  ?Cardiovascular:  ?   Rate and  Rhythm: Normal rate and regular rhythm.  ?   Heart sounds: No murmur heard. ?Pulmonary:  ?   Effort: Pulmonary effort is normal. No respiratory distress.  ?   Breath sounds: Normal breath sounds.  ?Abdominal:  ?   Palpations: Abdomen is soft.  ?   Tenderness: There is no abdominal tenderness.  ?Musculoskeletal:     ?   General: Swelling and tenderness present.  ?   Cervical back: Normal range of motion and neck supple.  ?   Comments: Left ankle with swelling increased warmth.  Good cap refill distally.  Dorsalis pedis pulse 2+.  Sensation intact.  No proximal leg swelling.  Left hand with swelling at the left index finger at the DIP  joint.  No sausage finger.  Good cap refill distally and sensation intact.  ?Skin: ?   General: Skin is warm and dry.  ?   Capillary Refill: Capillary refill takes less than 2 seconds.  ?Neurological:  ?   General: No focal deficit present.  ?   Mental Status: He is alert and oriented to person, place, and time.  ?Psychiatric:     ?   Mood and Affect: Mood normal.  ? ? ?ED Results / Procedures / Treatments   ?Labs ?(all labs ordered are listed, but only abnormal results are displayed) ?Labs Reviewed - No data to display ? ?EKG ?None ? ?Radiology ?No results found. ? ?Procedures ?Procedures  ? ? ?Medications Ordered in ED ?Medications  ?HYDROcodone-acetaminophen (NORCO/VICODIN) 5-325 MG per tablet 1 tablet (has no administration in time range)  ?predniSONE (DELTASONE) tablet 60 mg (has no administration in time range)  ? ? ?ED Course/ Medical Decision Making/ A&P ?  ?                        ?Medical Decision Making ?Risk ?Prescription drug management. ? ?Patient's symptoms and physical exam consistent with gout flare of the left index finger and the left ankle.  We will treat with hydrocodone here and 60 mg of prednisone.  Discharge him home with a course of hydrocodone and 5-day course of prednisone.  He will follow-up with his primary care doctor.  He will return for any new or worse symptoms.  No fall or injury.  So I do not think patient needs x-rays. ? ?Patient has a longstanding history of gout to multiple joints.  And often presents with multiple joints involved at one time. ? ?Final Clinical Impression(s) / ED Diagnoses ?Final diagnoses:  ?Acute gout of multiple sites, unspecified cause  ? ? ?Rx / DC Orders ?ED Discharge Orders   ? ?      Ordered  ?  HYDROcodone-acetaminophen (NORCO/VICODIN) 5-325 MG tablet  Every 6 hours PRN       ? 08/16/21 1652  ?  predniSONE (DELTASONE) 10 MG tablet  Daily       ? 08/16/21 1652  ? ?  ?  ? ?  ? ? ?  ?Vanetta Mulders, MD ?08/16/21 1655 ? ?

## 2021-08-19 ENCOUNTER — Other Ambulatory Visit: Payer: Self-pay | Admitting: Podiatry

## 2021-08-19 ENCOUNTER — Encounter: Payer: Self-pay | Admitting: Podiatry

## 2021-08-19 ENCOUNTER — Ambulatory Visit (INDEPENDENT_AMBULATORY_CARE_PROVIDER_SITE_OTHER): Payer: Self-pay

## 2021-08-19 ENCOUNTER — Ambulatory Visit (INDEPENDENT_AMBULATORY_CARE_PROVIDER_SITE_OTHER): Payer: Self-pay | Admitting: Podiatry

## 2021-08-19 DIAGNOSIS — M7752 Other enthesopathy of left foot: Secondary | ICD-10-CM

## 2021-08-19 DIAGNOSIS — M778 Other enthesopathies, not elsewhere classified: Secondary | ICD-10-CM

## 2021-08-19 DIAGNOSIS — M109 Gout, unspecified: Secondary | ICD-10-CM

## 2021-08-19 MED ORDER — COLCHICINE 0.6 MG PO TABS
ORAL_TABLET | ORAL | 1 refills | Status: AC
Start: 2021-08-19 — End: ?

## 2021-08-19 MED ORDER — TRIAMCINOLONE ACETONIDE 40 MG/ML IJ SUSP
40.0000 mg | Freq: Once | INTRAMUSCULAR | Status: AC
  Administered 2021-08-19: 40 mg

## 2021-08-19 MED ORDER — ALLOPURINOL 100 MG PO TABS
200.0000 mg | ORAL_TABLET | Freq: Every day | ORAL | 5 refills | Status: AC
Start: 2021-08-19 — End: ?

## 2021-08-19 NOTE — Progress Notes (Signed)
?Subjective:  ?Patient ID: Nicholas [name], male    DOB: 12-Oct-1980,  MRN: [id number] ?HPI ?Chief Complaint  ?Patient presents with  ? Foot Pain  ?  Medial foot/medial and anterior ankle left - woke up Thursday in pain, foot and ankle swollen, went to ER-no xray, Rx'd prednisone and hydrocondone  ? New Patient (Initial Visit)  ?  Est pt 10/2018  ? ? ?41 y.o. male presents with the above complaint.  ? ?ROS: Denies fever chills nausea vomiting muscle aches pains calf pain back pain chest pain shortness of breath. ? ?Past Medical History:  ?Diagnosis Date  ? Anxiety   ? Arthritis   ? Depression   ? Gout   ? Hypertension   ? ?Past Surgical History:  ?Procedure Laterality Date  ? APPENDECTOMY    ? CHOLECYSTECTOMY    ? FOOT SURGERY Left   ? GASTRIC BYPASS    ? ? ?Current Outpatient Medications:  ?  allopurinol (ZYLOPRIM) 100 MG tablet, Take 2 tablets (200 mg total) by mouth daily., Disp: 60 tablet, Rfl: 5 ?  colchicine 0.6 MG tablet, Take 1 tablet TID until GI upset or flare subsides, then for maintenance, take 1 tablet daily., Disp: 60 tablet, Rfl: 1 ?  HYDROcodone-acetaminophen (NORCO/VICODIN) 5-325 MG tablet, Take 1 tablet by mouth every 6 (six) hours as needed for moderate pain., Disp: 20 tablet, Rfl: 0 ?  hydrOXYzine (ATARAX/VISTARIL) 25 MG tablet, TAKE 1 TABLET BY MOUTH THREE TIMES A DAY AS NEEDED FOR ANXIETY, Disp: , Rfl:  ?  indomethacin (INDOCIN) 50 MG capsule, Take by mouth., Disp: , Rfl:  ?  meloxicam (MOBIC) 15 MG tablet, TAKE 1 TABLET BY MOUTH EVERY DAY, Disp: 90 tablet, Rfl: 3 ?  omeprazole (PRILOSEC) 40 MG capsule, Take 40 mg by mouth daily., Disp: , Rfl:  ?  predniSONE (DELTASONE) 10 MG tablet, Take 4 tablets (40 mg total) by mouth daily., Disp: 20 tablet, Rfl: 0 ? ?Current Facility-Administered Medications:  ?  0.9 %  sodium chloride infusion, 500 mL, Intravenous, Once, Hilarie Fredrickson, MD ?  0.9 %  sodium chloride infusion, 500 mL, Intravenous, Once, Hilarie Fredrickson, MD ? ?No Known Allergies ?Review of  Systems ?Objective:  ?There were no vitals filed for this visit. ? ?General: Well developed, nourished, in no acute distress, alert and oriented x3  ? ?Dermatological: Skin is warm, dry and supple bilateral. Nails x 10 are well maintained; remaining integument appears unremarkable at this time. There are no open sores, no preulcerative lesions, no rash or signs of infection present. ? ?Vascular: Dorsalis Pedis artery and Posterior Tibial artery pedal pulses are 2/4 bilateral with immedate capillary fill time. Pedal hair growth present. No varicosities and no lower extremity edema present bilateral.  ? ?Neruologic: Grossly intact via light touch bilateral. Vibratory intact via tuning fork bilateral. Protective threshold with Semmes Wienstein monofilament intact to all pedal sites bilateral. Patellar and Achilles deep tendon reflexes 2+ bilateral. No Babinski or clonus noted bilateral.  ? ?Musculoskeletal: No gross boney pedal deformities bilateral. No pain, crepitus, or limitation noted with foot and ankle range of motion bilateral. Muscular strength 5/5 in all groups tested bilateral.  Red hot swollen medial lateral aspect of the subtalar joint ankle joint peroneal tendons and posterior tibial tendon with little range of motion. ? ?Gait: Unassisted, Nonantalgic.  ? ? ?Radiographs: ? ?Radiographs taken today demonstrate an osseously mature individual moderate edema in the areas in question otherwise relatively unremarkable. ? ?Assessment & Plan:  ? ?Assessment:  Most likely gouty capsulitis left foot. ? ?Plan: Discussed etiology pathology conservative surgical therapies at this point I injected medially and laterally.  Injected him with Kenalog and local anesthetic a total of 20 mg was utilized split between medial and lateral.  This was not injected into the tendons but subcutaneously in the area of the most pain.  I did inject the anterolateral ankle after sterile Betadine skin prep.  He will continue his 10 mg of  prednisone.  I recommended he started on colchicine at least 1 a day until this resolves.  I sent that over for him.  I also sent over an increase in his allopurinol. ? ?He recently had blood work performed by his primary care provider and it is not in their documentation as of yet.  He states that he would let me know what those findings are as far as the uric acid levels. ? ? ? ? ?Melina Mosteller T. [place], DPM ?

## 2021-08-26 ENCOUNTER — Telehealth: Payer: Self-pay | Admitting: *Deleted

## 2021-08-26 NOTE — Telephone Encounter (Signed)
"  I had a visit with Dr. Al Corpus last week about some swelling in my foot.  Can he prescribe me some Prednisone that we talked about?  He said to call him if it didn't get any better before my next appointment." ?

## 2021-08-27 ENCOUNTER — Other Ambulatory Visit: Payer: Self-pay | Admitting: Podiatry

## 2021-08-27 MED ORDER — METHYLPREDNISOLONE 4 MG PO TBPK: ORAL_TABLET | ORAL | 0 refills | Status: DC

## 2021-08-27 NOTE — Telephone Encounter (Signed)
Prescription sent to pharmacy. I notified the patient through MyChart ?

## 2021-08-27 NOTE — Telephone Encounter (Signed)
Pt called and I notified him the rx was sent to pharmacy for him. ?

## 2021-09-02 ENCOUNTER — Ambulatory Visit: Payer: Self-pay | Admitting: Podiatry

## 2021-10-15 ENCOUNTER — Other Ambulatory Visit: Payer: Self-pay | Admitting: Podiatry

## 2021-10-16 MED ORDER — METHYLPREDNISOLONE 4 MG PO TBPK: ORAL_TABLET | ORAL | 0 refills | Status: DC

## 2021-11-28 ENCOUNTER — Other Ambulatory Visit: Payer: Self-pay | Admitting: Podiatry

## 2021-11-28 MED ORDER — METHYLPREDNISOLONE 4 MG PO TBPK: ORAL_TABLET | ORAL | 0 refills | Status: AC

## 2021-11-28 NOTE — Telephone Encounter (Signed)
Please advise 

## 2021-12-06 ENCOUNTER — Telehealth: Payer: Self-pay | Admitting: *Deleted

## 2021-12-06 NOTE — Telephone Encounter (Signed)
Patient is calling because he started having symptoms of another gout attack, after taking a recent prescription of prednisone.Please schedule a f/u appointment for reevaluation.

## 2021-12-08 ENCOUNTER — Encounter: Payer: Self-pay | Admitting: Podiatry

## 2021-12-09 ENCOUNTER — Encounter: Payer: Self-pay | Admitting: Podiatry

## 2021-12-09 ENCOUNTER — Ambulatory Visit: Payer: Self-pay | Admitting: Podiatry

## 2022-04-15 ENCOUNTER — Other Ambulatory Visit: Payer: Self-pay | Admitting: Podiatry

## 2022-05-11 IMAGING — CR DG FINGER LITTLE 2+V*R*
3 series · 3 of 3 positions shown · non-contrast
Comparison: Right hand x-rays dated January 13, 2017.

CLINICAL DATA: Right small finger pain and swelling. No injury.
History of gout.

EXAM:
RIGHT LITTLE FINGER 2+V

[finger ap]
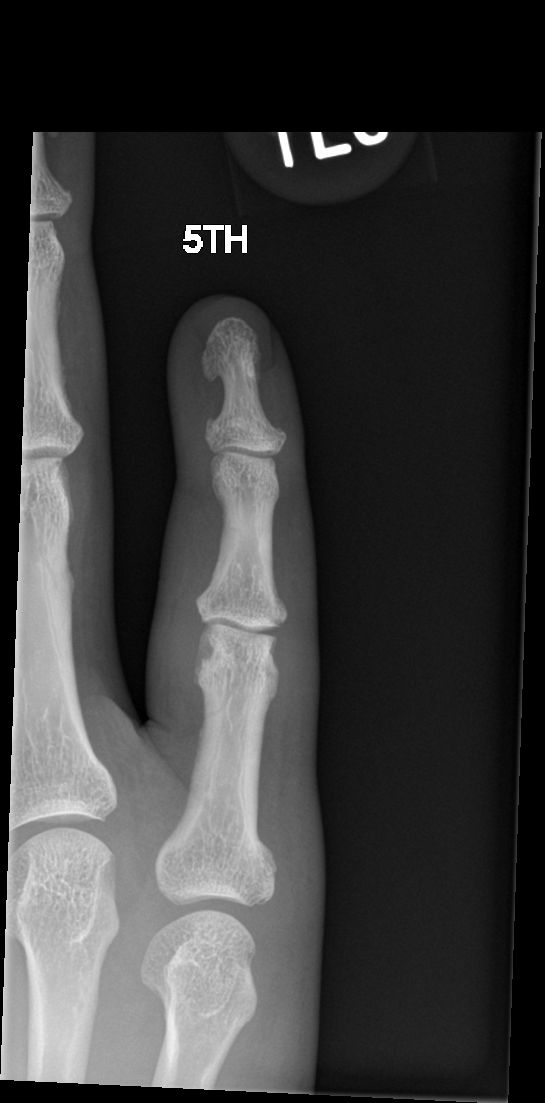

[finger obl]
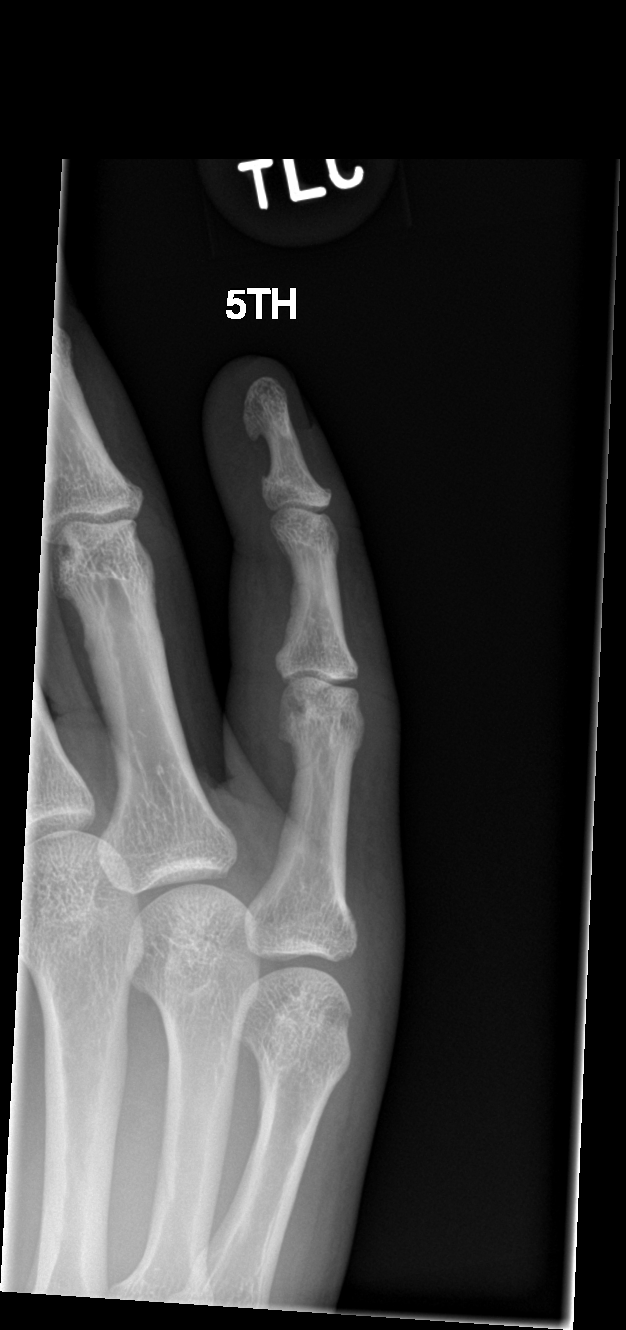

[finger lat]
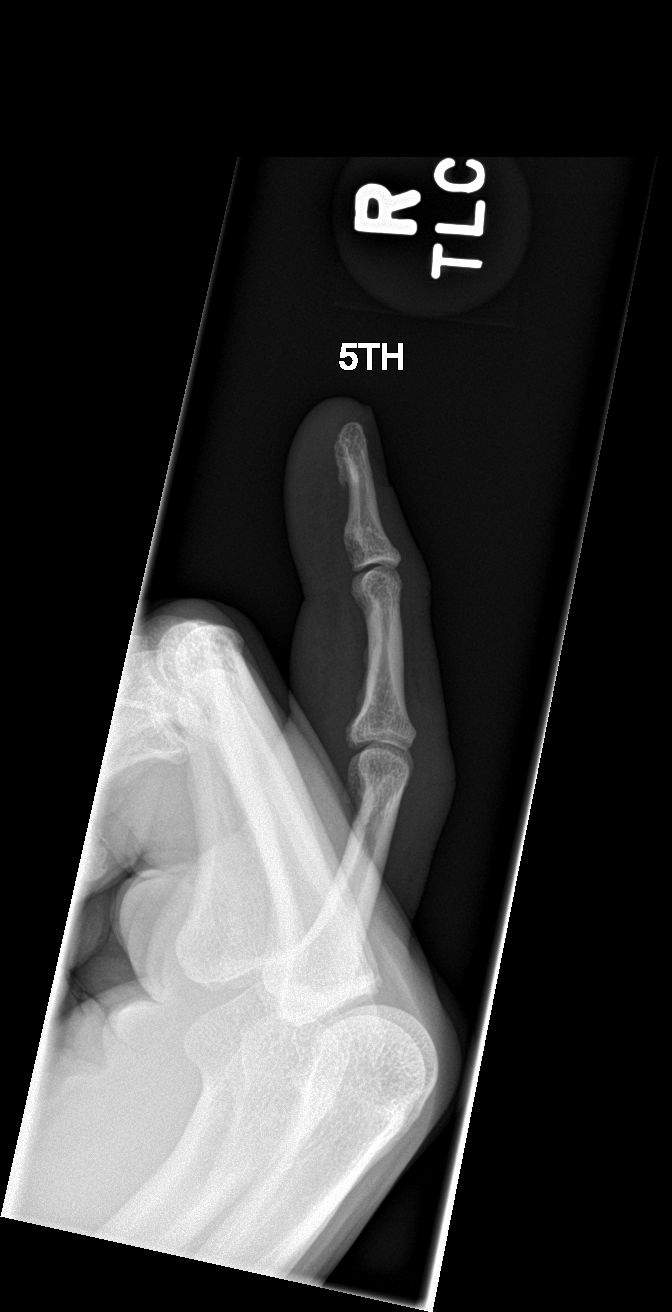

[3 of 3 positions shown; findings below may reference images not displayed]

FINDINGS: No acute fracture or dislocation. Chronic juxta-articular erosion
along the radial aspect of the fifth proximal phalanx head. No new
erosions. Joint spaces are preserved. Bone mineralization is normal.
Soft tissue swelling of the small finger.
IMPRESSION: 1. Soft tissue swelling of the small finger. No acute osseous
abnormality.
2. Chronic erosion of the fifth proximal phalanx head consistent
with history of gout. No new erosive changes.

## 2024-02-18 ENCOUNTER — Telehealth: Payer: Self-pay | Admitting: Podiatry

## 2024-02-18 NOTE — Telephone Encounter (Signed)
 Patient called and asked if he could reorder orthotics from 2023. I told him we no longer have access to the manufacturer. I also let him know that if he wants new orthotics, he would need to see Dr. Verta first since it has been over a year. He asked how much self pay would be for an office visit. I told him much it would be and after no response from him he hung up.

## 2024-03-03 ENCOUNTER — Telehealth: Payer: Self-pay | Admitting: Podiatry

## 2024-03-03 NOTE — Telephone Encounter (Signed)
 Patient called in stating he would like to get information about payment plans for orthotics. He is a self pay patient.

## 2024-03-07 NOTE — Telephone Encounter (Signed)
 I spoke with patient and he understands his responsibility.

## 2024-03-25 ENCOUNTER — Other Ambulatory Visit: Payer: Self-pay
